# Patient Record
Sex: Male | Born: 1960
Health system: Southern US, Community
[De-identification: ages and names within clinical notes are randomized; demographics above are authoritative.]

## PROBLEM LIST (undated history)

## (undated) DIAGNOSIS — K5730 Diverticulosis of large intestine without perforation or abscess without bleeding: Secondary | ICD-10-CM

## (undated) DIAGNOSIS — E785 Hyperlipidemia, unspecified: Secondary | ICD-10-CM

## (undated) DIAGNOSIS — H269 Unspecified cataract: Secondary | ICD-10-CM

## (undated) DIAGNOSIS — R079 Chest pain, unspecified: Secondary | ICD-10-CM

## (undated) DIAGNOSIS — I4510 Unspecified right bundle-branch block: Secondary | ICD-10-CM

## (undated) DIAGNOSIS — E7800 Pure hypercholesterolemia, unspecified: Secondary | ICD-10-CM

## (undated) DIAGNOSIS — K219 Gastro-esophageal reflux disease without esophagitis: Secondary | ICD-10-CM

## (undated) DIAGNOSIS — R011 Cardiac murmur, unspecified: Secondary | ICD-10-CM

## (undated) DIAGNOSIS — K648 Other hemorrhoids: Secondary | ICD-10-CM

## (undated) HISTORY — DX: Hyperlipidemia, unspecified: E78.5

## (undated) HISTORY — DX: Unspecified cataract: H26.9

## (undated) HISTORY — PX: COLONOSCOPY: SHX174

## (undated) HISTORY — DX: Diverticulosis of large intestine without perforation or abscess without bleeding: K57.30

## (undated) HISTORY — DX: Other hemorrhoids: K64.8

## (undated) HISTORY — DX: Gastro-esophageal reflux disease without esophagitis: K21.9

## (undated) HISTORY — DX: Unspecified right bundle-branch block: I45.10

## (undated) HISTORY — DX: Cardiac murmur, unspecified: R01.1

---
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
## 1992-09-03 HISTORY — PX: APPENDECTOMY: SHX54

## 2006-06-03 LAB — HM COLONOSCOPY

## 2012-03-03 ENCOUNTER — Encounter (HOSPITAL_COMMUNITY): Payer: Self-pay | Admitting: Emergency Medicine

## 2012-03-03 ENCOUNTER — Emergency Department (HOSPITAL_COMMUNITY): Payer: 59

## 2012-03-03 ENCOUNTER — Emergency Department (HOSPITAL_COMMUNITY)
Admission: EM | Admit: 2012-03-03 | Discharge: 2012-03-03 | Disposition: A | Discharge status: Home / Self Care | Payer: 59 | Attending: Emergency Medicine | Admitting: Emergency Medicine

## 2012-03-03 DIAGNOSIS — S6980XA Other specified injuries of unspecified wrist, hand and finger(s), initial encounter: Secondary | ICD-10-CM | POA: Insufficient documentation

## 2012-03-03 DIAGNOSIS — Z9089 Acquired absence of other organs: Secondary | ICD-10-CM | POA: Insufficient documentation

## 2012-03-03 DIAGNOSIS — S6990XA Unspecified injury of unspecified wrist, hand and finger(s), initial encounter: Secondary | ICD-10-CM | POA: Insufficient documentation

## 2012-03-03 DIAGNOSIS — S6710XA Crushing injury of unspecified finger(s), initial encounter: Secondary | ICD-10-CM

## 2012-03-03 DIAGNOSIS — X58XXXA Exposure to other specified factors, initial encounter: Secondary | ICD-10-CM | POA: Insufficient documentation

## 2012-03-03 MED ORDER — BACITRACIN-NEOMYCIN-POLYMYXIN 400-5-5000 EX OINT
TOPICAL_OINTMENT | Freq: Once | CUTANEOUS | Status: AC
  Administered 2012-03-03: 1 via TOPICAL
  Filled 2012-03-03: qty 1

## 2012-03-03 NOTE — ED Provider Notes (Signed)
History   This chart was scribed for Geoffery Lyons, MD by Sofie Rower. The patient was seen in room APA07/APA07 and the patient's care was started at 8:17 AM     CSN: [id number]  Arrival date & time 03/03/12  4098   First MD Initiated Contact with Patient 03/03/12 0809      Chief Complaint  Patient presents with  . Finger Injury    (Consider location/radiation/quality/duration/timing/severity/associated sxs/prior treatment) HPI  Charles Doyle is a 51 y.o. male who presents to the Emergency Department complaining of moderate, episodic finger injury located at the left pinky finger onset today. The pt was reaching for the garage door handle when his pinky got caught. Modifying factors include application of bandage which provides moderate relief. Pt has a hx of appendectomy, tonsillectomy. Pt does not remember when he had his last tetanus shot. 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   PCP is Dr.    History reviewed. No pertinent past medical history.  Past Surgical History  Procedure Date  . Appendectomy   . Tonsillectomy     No family history on file.  History  Substance Use Topics  . Smoking status: Never Smoker   . Smokeless tobacco: Not on file  . Alcohol Use: Yes     occasional      Review of Systems  All other systems reviewed and are negative.    10 Systems reviewed and all are negative for acute change except as noted in the HPI.    Allergies  Review of patient's allergies indicates no known allergies.  Home Medications  No current outpatient prescriptions on file.  BP 125/86  Pulse 76  Temp 98.3 F (36.8 C) (Oral)  Resp 20  Ht 6\' 3"  (1.905 m)  Wt 208 lb (94.348 kg)  BMI 26.00 kg/m2  SpO2 96%  Physical Exam  Nursing note and vitals reviewed. Constitutional: He appears well-developed and well-nourished.  HENT:  Head: Atraumatic.  Right Ear: External ear normal.  Left Ear: External ear normal.  Nose: Nose normal.  Musculoskeletal:  Normal range of motion.       Laceration located at the left pinky finger. Palmer aspect of distal pha ynx, pinched, contused area with small .5 cm laceration. Swelling and ecchymosis present.   Neurological: He is alert.  Skin: Skin is warm and dry.  Psychiatric: He has a normal mood and affect. His behavior is normal.    ED Course  Procedures (including critical care time)  DIAGNOSTIC STUDIES: Oxygen Saturation is 96% on room air, adequate by my interpretation.    COORDINATION OF CARE:  8:21AM- EDP at bedside discusses treatment plan concerning application of splint on the left pinky finger, x-ray results.    Labs Reviewed - No data to display No results found for this or any previous visit. Dg Finger Little Left  03/03/2012  *RADIOLOGY REPORT*  Clinical Data: Trauma  LEFT LITTLE FINGER 2+V  Comparison: None.  Findings: Three views of the left fifth finger submitted.  No displaced fracture or subluxation.  The study is limited by bandage artifact.  There is subtle lucency at the tip of distal phalanx.  A subtle nondisplaced fracture cannot be excluded.  IMPRESSION: .  No displaced fracture or subluxation.  The study is limited by bandage artifact.  There is subtle lucency at the tip of distal phalanx.  A subtle nondisplaced fracture cannot be excluded.  Original Report Authenticated By: Natasha Mead, M.D.      No  diagnosis found.    MDM  No sutures indicated.  Will apply dressing and splint and allow to heal.  Return prn.      I personally performed the services described in this documentation, which was scribed in my presence. The recorded information has been reviewed and considered.      Geoffery Lyons, MD 03/04/12 [phone]

## 2012-03-03 NOTE — ED Notes (Signed)
Dressing applied to left 5 th finger with bacitracin, gauze and finger splint.

## 2012-03-03 NOTE — ED Notes (Signed)
Pt caught his left pinky in garage door this am.

## 2012-03-03 NOTE — ED Notes (Signed)
Pt has laceration to his left 5 th finger. States that he got it stuck in his garage door. No bleeding at this time. Pt states that he took 3 ibuprofen on the way to the hospital and it is controlling the pain.

## 2012-03-03 NOTE — Discharge Instructions (Signed)
Crush Injury, Fingers or Toes  A crush injury to the fingers or toes means the tissues have been damaged by being squeezed (compressed). There will be bleeding into the tissues and swelling. Often, blood will collect under the skin. When this happens, the skin on the finger often dies and may slough off (shed) 1 week to 10 days later. Usually, new skin is growing underneath. If the injury has been too severe and the tissue does not survive, the damaged tissue may begin to turn black over several days.   Wounds which occur because of the crushing may be stitched (sutured) shut. However, crush injuries are more likely to become infected than other injuries.These wounds may not be closed as tightly as other types of cuts to prevent infection. Nails involved are often lost. These usually grow back over several weeks.   DIAGNOSIS  X-rays may be taken to see if there is any injury to the bones.  TREATMENT  Broken bones (fractures) may be treated with splinting, depending on the fracture. Often, no treatment is required for fractures of the last bone in the fingers or toes.  HOME CARE INSTRUCTIONS    The crushed part should be raised (elevated) above the heart or center of the chest as much as possible for the first several days or as directed. This helps with pain and lessens swelling. Less swelling increases the chances that the crushed part will survive.   Put ice on the injured area.   Put ice in a plastic bag.   Place a towel between your skin and the bag.   Leave the ice on for 15 to 20 minutes, 3 to 4 times a day for the first 2 days.   Only take over-the-counter or prescription medicines for pain, discomfort, or fever as directed by your caregiver.   Use your injured part only as directed.   Change your bandages (dressings) as directed.   Keep all follow-up appointments as directed by your caregiver. Not keeping your appointment could result in a chronic or permanent injury, pain, and disability. If there  is any problem keeping the appointment, you must call to reschedule.  SEEK IMMEDIATE MEDICAL CARE IF:    There is redness, swelling, or increasing pain in the wound area.   Pus is coming from the wound.   You have a fever.   You notice a bad smell coming from the wound or dressing.   The edges of the wound do not stay together after the sutures have been removed.   You are unable to move the injured finger or toe.  MAKE SURE YOU:    Understand these instructions.   Will watch your condition.   Will get help right away if you are not doing well or get worse.  Document Released: 08/20/2005 Document Revised: 08/09/2011 Document Reviewed: 01/05/2011  Exit[employer] Patient Information 2012 [employer].

## 2012-04-10 ENCOUNTER — Emergency Department (HOSPITAL_COMMUNITY)
Admission: EM | Admit: 2012-04-10 | Discharge: 2012-04-10 | Disposition: A | Discharge status: Home / Self Care | Payer: 59 | Attending: Emergency Medicine | Admitting: Emergency Medicine

## 2012-04-10 ENCOUNTER — Encounter (HOSPITAL_COMMUNITY): Payer: Self-pay | Admitting: *Deleted

## 2012-04-10 ENCOUNTER — Emergency Department (HOSPITAL_COMMUNITY): Payer: 59

## 2012-04-10 DIAGNOSIS — Z9089 Acquired absence of other organs: Secondary | ICD-10-CM | POA: Insufficient documentation

## 2012-04-10 DIAGNOSIS — R079 Chest pain, unspecified: Secondary | ICD-10-CM | POA: Insufficient documentation

## 2012-04-10 DIAGNOSIS — E7800 Pure hypercholesterolemia, unspecified: Secondary | ICD-10-CM | POA: Insufficient documentation

## 2012-04-10 HISTORY — DX: Pure hypercholesterolemia, unspecified: E78.00

## 2012-04-10 LAB — BASIC METABOLIC PANEL
GFR calc Af Amer: >90 mL/min (ref >90)
GFR calc non Af Amer: 86 mL/min — ABNORMAL LOW (ref >90)
Potassium: 3.8 mEq/L (ref 3.5–5.1)
Sodium: 140 mEq/L (ref 135–145)

## 2012-04-10 LAB — CBC
Hemoglobin: 14.2 g/dL (ref 13.0–17.0)
MCHC: 34.1 g/dL (ref 30.0–36.0)
Platelets: 174 10*3/uL (ref 150–400)
RDW: 13.4 % (ref 11.5–15.5)

## 2012-04-10 LAB — TROPONIN I
Troponin I: <0.3 ng/mL (ref <0.30)
Troponin I: <0.3 ng/mL (ref <0.30)

## 2012-04-10 LAB — PRO B NATRIURETIC PEPTIDE: Pro B Natriuretic peptide (BNP): 7.9 pg/mL (ref 0–125)

## 2012-04-10 MED ORDER — NITROGLYCERIN 0.4 MG SL SUBL
0.4000 mg | SUBLINGUAL_TABLET | Freq: Once | SUBLINGUAL | Status: AC
  Administered 2012-04-10: 0.4 mg via SUBLINGUAL
  Filled 2012-04-10: qty 25

## 2012-04-10 MED ORDER — ASPIRIN 325 MG PO TABS
325.0000 mg | ORAL_TABLET | ORAL | Status: AC
  Administered 2012-04-10: 325 mg via ORAL
  Filled 2012-04-10: qty 1

## 2012-04-10 NOTE — ED Notes (Signed)
Charles Doyle was called and an appointment was scheduled for pt tomorrow 04/11/2012 at 1615. RN and MD made aware.

## 2012-04-10 NOTE — ED Notes (Signed)
Pt reports pain was relieved mostly before nitro, no change since administration. Pt reports he can feel a difference with position change.

## 2012-04-10 NOTE — ED Provider Notes (Signed)
History   This chart was scribed for Donnetta Hutching, MD by Charolett Bumpers . The patient was seen in room APA11/APA11. Patient's care was started at 0756.    CSN: [id number]  Arrival date & time 04/10/12  1308   First MD Initiated Contact with Patient 04/10/12 [phone]      Chief Complaint  Patient presents with  . Chest Pain    (Consider location/radiation/quality/duration/timing/severity/associated sxs/prior treatment) HPI Charles Doyle is a 50 y.o. male who presents to the Emergency Department complaining of constant, moderate, mid-sternal chest pain. Pt reports that his chest pain started this morning when he woke up. Pt states that his chest pain gradually worsening. Pt describes his chest pain as central, deep, pressure and sharp ache. Pt reports that today's chest pain does not feel like heart burn that he has experienced previously. Pt states that his chest pain is still present. Pt reports that he thought he felt radiation into his back and neck, but reports no radiation currently. Pt reports that his symptoms are aggravated with deep breaths. Pt denies any SOB, diaphoresis or nausea. Pt denies any prior medical h/o diabetes or HTN. Pt reports a medical h/o elevated cholesterol, but states that his HDL is high. Pt denies smoking. Pt denies taking aspirin daily currently. Pt states that his paternal grandfather died of MI at 84. 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   PCP: Dr. Donzetta Sprung in [place].  Past Medical History  Diagnosis Date  . Elevated cholesterol     Past Surgical History  Procedure Date  . Appendectomy   . Tonsillectomy     History reviewed. No pertinent family history.  History  Substance Use Topics  . Smoking status: Never Smoker   . Smokeless tobacco: Not on file  . Alcohol Use: Yes     occasional      Review of Systems  Constitutional: Negative for diaphoresis.  Respiratory: Negative for shortness of breath.   Cardiovascular: Positive for chest  pain.  Gastrointestinal: Negative for nausea.  All other systems reviewed and are negative.    Allergies  Review of patient's allergies indicates no known allergies.  Home Medications  No current outpatient prescriptions on file.  BP 120/90  Pulse 69  Temp 98.1 F (36.7 C) (Oral)  Resp 17  Ht 6\' 3"  (1.905 m)  Wt 212 lb (96.163 kg)  BMI 26.50 kg/m2  SpO2 96%  Physical Exam  Nursing note and vitals reviewed. Constitutional: He is oriented to person, place, and time. He appears well-developed and well-nourished. No distress.  HENT:  Head: Normocephalic and atraumatic.  Eyes: EOM are normal. Pupils are equal, round, and reactive to light.  Neck: Normal range of motion. Neck supple. No tracheal deviation present.  Cardiovascular: Normal rate.   Pulmonary/Chest: Effort normal. No respiratory distress. He exhibits no tenderness.  Abdominal: Soft. He exhibits no distension.  Musculoskeletal: Normal range of motion. He exhibits no edema.  Neurological: He is alert and oriented to person, place, and time. No sensory deficit.  Skin: Skin is warm and dry.  Psychiatric: He has a normal mood and affect. His behavior is normal.    ED Course  Procedures (including critical care time)  DIAGNOSTIC STUDIES: Oxygen Saturation is 97% on room air, normal by my interpretation.    COORDINATION OF CARE:  08:00-Medication Orders: Aspirin tablet 325-mg-STAT  08:12-Discussed planned course of treatment with the patient including routine cardiac workup, who is agreeable at this time.   08:30-Medication Orders:  Nitroglycerin (Nitrostat) SL tablet 0.4 mg-once  09:00-Recheck: Informed pt of lab and imaging results.   11:30-Consultation with [facility]. Discussed pt's case and ED results. Pt will f/u in the office tomorrow at 4:15pm.   Results for orders placed during the hospital encounter of 04/10/12  CBC      Component Value Range   WBC 7.7  4.0 - 10.5 K/uL   RBC 4.93  4.22 -  5.81 MIL/uL   Hemoglobin 14.2  13.0 - 17.0 g/dL   HCT 40.9  81.1 - 91.4 %   MCV 84.6  78.0 - 100.0 fL   MCH 28.8  26.0 - 34.0 pg   MCHC 34.1  30.0 - 36.0 g/dL   RDW 78.2  95.6 - 21.3 %   Platelets 174  150 - 400 K/uL  BASIC METABOLIC PANEL      Component Value Range   Sodium 140  135 - 145 mEq/L   Potassium 3.8  3.5 - 5.1 mEq/L   Chloride 106  96 - 112 mEq/L   CO2 26  19 - 32 mEq/L   Glucose, Bld 103 (*) 70 - 99 mg/dL   BUN 17  6 - 23 mg/dL   Creatinine, Ser 0.86  0.50 - 1.35 mg/dL   Calcium 57.8  8.4 - 46.9 mg/dL   GFR calc non Af Amer 86 (*) >90 mL/min   GFR calc Af Amer >90  >90 mL/min  PRO B NATRIURETIC PEPTIDE      Component Value Range   Pro B Natriuretic peptide (BNP) 7.9  0 - 125 pg/mL  TROPONIN I      Component Value Range   Troponin I <0.30  <0.30 ng/mL  TROPONIN I      Component Value Range   Troponin I <0.30  <0.30 ng/mL     Dg Chest 2 View  04/10/2012  *RADIOLOGY REPORT*  Clinical Data: Chest pain  CHEST - 2 VIEW  Comparison: None.  Findings: Aorta is ectatic and unfolded.  Cardiomediastinal silhouette is within normal limits. The lungs are clear. No pleural effusion.  No pneumothorax.  No acute osseous abnormality. Costophrenic angles are omitted from the frontal and lateral projections which could obscure trace effusion. Minimal inferior endplate compression deformity noted at a lower thoracic vertebral body, age indeterminate. Minimal AC joint degenerative change.  IMPRESSION: Age indeterminate minimal inferior endplate compression deformity at the inferior thoracic spine.  No acute cardiopulmonary process otherwise identified.  Original Report Authenticated By: Harrel Lemon, M.D.     No diagnosis found.  Date: 04/10/2012  Rate: 87  Rhythm: normal sinus rhythm  QRS Axis: left  Intervals: normal  ST/T Wave abnormalities: normal  Conduction Disutrbances:right bundle branch block  Narrative Interpretation:   Old EKG Reviewed: none available    MDM    Cardiac risk factors are low. History is vague for ACS or pulmonary embolus.  Will see cardiologist tomorrow at 4:15. Troponin negative x2    I personally performed the services described in this documentation, which was scribed in my presence. The recorded information has been reviewed and considered.      Donnetta Hutching, MD 04/10/12 1248

## 2012-04-10 NOTE — ED Notes (Signed)
Pt woke up with pain and pressure in center of chest

## 2012-04-11 ENCOUNTER — Encounter: Payer: 59 | Admitting: Cardiovascular Disease

## 2012-04-14 ENCOUNTER — Encounter: Payer: Self-pay | Admitting: Cardiology

## 2012-04-14 ENCOUNTER — Ambulatory Visit (INDEPENDENT_AMBULATORY_CARE_PROVIDER_SITE_OTHER): Payer: 59 | Admitting: Cardiovascular Disease

## 2012-04-14 ENCOUNTER — Encounter: Payer: Self-pay | Admitting: Cardiovascular Disease

## 2012-04-14 VITALS — Ht 75.0 in | Wt 210.0 lb

## 2012-04-14 DIAGNOSIS — I4510 Unspecified right bundle-branch block: Secondary | ICD-10-CM

## 2012-04-14 DIAGNOSIS — E7800 Pure hypercholesterolemia, unspecified: Secondary | ICD-10-CM | POA: Insufficient documentation

## 2012-04-14 DIAGNOSIS — R079 Chest pain, unspecified: Secondary | ICD-10-CM

## 2012-04-14 DIAGNOSIS — R011 Cardiac murmur, unspecified: Secondary | ICD-10-CM

## 2012-04-14 DIAGNOSIS — K219 Gastro-esophageal reflux disease without esophagitis: Secondary | ICD-10-CM

## 2012-04-14 DIAGNOSIS — R9431 Abnormal electrocardiogram [ECG] [EKG]: Secondary | ICD-10-CM

## 2012-04-14 HISTORY — DX: Pure hypercholesterolemia, unspecified: E78.00

## 2012-04-14 HISTORY — DX: Cardiac murmur, unspecified: R01.1

## 2012-04-14 HISTORY — DX: Unspecified right bundle-branch block: I45.10

## 2012-04-14 NOTE — Assessment & Plan Note (Signed)
Continue PPI  F/U Dr Garner Nash  Consider endo if symtoms return when PPI stopped

## 2012-04-14 NOTE — Assessment & Plan Note (Signed)
Patient indicates this was Dx in late 80's.  Gave him a copy of his ECG

## 2012-04-14 NOTE — Assessment & Plan Note (Signed)
Atypical likely GI  Family history of CAD and abnormal ECG  F/U stress echo in [place]

## 2012-04-14 NOTE — Progress Notes (Signed)
Patient ID: Charles Doyle, male   DOB: August 14, 1961, 51 y.o.   MRN: [id number] 51 yo patient referred by ER for chest pain.  Seen in ER  8/8.  complaining of constant, moderate, mid-sternal chest pain. Pt reports that his chest pain started  when he woke up. Pt states that his chest pain gradually worsening. Pt describes his chest pain as central, deep, pressure and sharp ache. Pt reports that today's chest pain does not feel like heart burn that he has experienced previously. Pt states that his chest pain is still present. Pt reports that he thought he felt radiation into his back and neck, but reports no radiation currently. Pt reports that his symptoms are aggravated with deep breaths. Pt denies any SOB, diaphoresis or nausea. Pt denies any prior medical h/o diabetes or HTN. Pt reports a medical h/o elevated cholesterol, but states that his HDL is high. Pt denies smoking. Pt denies taking aspirin daily currently. Pt states that his paternal grandfather died of MI at 71. 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   R/O CXR NAD And ECG as described below.  Pain improved after starting PPI at home.  Pain was not helped by nitro in ER  ROS: Denies fever, malais, weight loss, blurry vision, decreased visual acuity, cough, sputum, SOB, hemoptysis, pleuritic pain, palpitaitons, heartburn, abdominal pain, melena, lower extremity edema, claudication, or rash.  All other systems reviewed and negative   General: Affect appropriate Healthy:  appears stated age HEENT: normal Neck supple with no adenopathy JVP normal no bruits no thyromegaly Lungs clear with no wheezing and good diaphragmatic motion Heart:  S1/S2 soft systolic murmur,rub, gallop or click PMI normal Abdomen: benighn, BS positve, no tenderness, no AAA no bruit.  No HSM or HJR Distal pulses intact with no bruits No edema Neuro non-focal Skin warm and dry No muscular weakness  Medications Current Outpatient Prescriptions  Medication Sig  Dispense Refill  . calcium carbonate (TUMS EX) 750 MG chewable tablet Chew 1 tablet by mouth as needed. For acid/upset stomach      . fish oil-omega-3 fatty acids 1000 MG capsule Take 1 g by mouth daily.      . Multiple Vitamin (MULTIVITAMIN WITH MINERALS) TABS Take 1 tablet by mouth daily.        Allergies Review of patient's allergies indicates no known allergies.  Family History: No family history on file.  Social History: History   Social History  . Marital Status: Married    Spouse Name: N/A    Number of Children: N/A  . Years of Education: N/A   Occupational History  . Not on file.   Social History Main Topics  . Smoking status: Never Smoker   . Smokeless tobacco: Not on file  . Alcohol Use: Yes     occasional  . Drug Use: No  . Sexually Active:    Other Topics Concern  . Not on file   Social History Narrative  . No narrative on file    Electrocardiogram:  8/7  SR RBBB LAD ? Old IMI  Rate 87    Assessment and Plan

## 2012-04-14 NOTE — Assessment & Plan Note (Signed)
F/U Dr Garner Nash  Consider red yeast rice

## 2012-04-14 NOTE — Patient Instructions (Addendum)
Your physician has requested that you have a stress echocardiogram. For further information please visit [email or web address]. Please follow instruction sheet as given.  Your physician recommends that you schedule a follow-up appointment in: we will contact you with results of test.  

## 2012-04-14 NOTE — Assessment & Plan Note (Signed)
Previously noted in [employer] Benign systolic  Screen with echo during ETT echo

## 2012-04-24 ENCOUNTER — Encounter: Payer: Self-pay | Admitting: Cardiovascular Disease

## 2012-04-24 ENCOUNTER — Ambulatory Visit (HOSPITAL_COMMUNITY): Payer: 59 | Attending: Cardiology

## 2012-04-24 ENCOUNTER — Telehealth: Payer: Self-pay | Admitting: Cardiovascular Disease

## 2012-04-24 ENCOUNTER — Ambulatory Visit (HOSPITAL_COMMUNITY): Payer: 59 | Attending: Cardiovascular Disease

## 2012-04-24 DIAGNOSIS — R072 Precordial pain: Secondary | ICD-10-CM

## 2012-04-24 DIAGNOSIS — K219 Gastro-esophageal reflux disease without esophagitis: Secondary | ICD-10-CM | POA: Insufficient documentation

## 2012-04-24 DIAGNOSIS — Z8249 Family history of ischemic heart disease and other diseases of the circulatory system: Secondary | ICD-10-CM | POA: Insufficient documentation

## 2012-04-24 DIAGNOSIS — R0989 Other specified symptoms and signs involving the circulatory and respiratory systems: Secondary | ICD-10-CM

## 2012-04-24 DIAGNOSIS — R079 Chest pain, unspecified: Secondary | ICD-10-CM

## 2012-04-24 DIAGNOSIS — R9431 Abnormal electrocardiogram [ECG] [EKG]: Secondary | ICD-10-CM

## 2012-04-24 NOTE — Progress Notes (Signed)
Echocardiogram performed.  

## 2012-04-24 NOTE — Telephone Encounter (Signed)
Walk in pt Form " Pt Dropped Off Records" Placed in [place] Doc Box 04/24/12/Km

## 2012-10-18 ENCOUNTER — Other Ambulatory Visit: Payer: Self-pay

## 2013-07-09 ENCOUNTER — Other Ambulatory Visit: Payer: Self-pay

## 2014-09-14 ENCOUNTER — Ambulatory Visit (INDEPENDENT_AMBULATORY_CARE_PROVIDER_SITE_OTHER): Payer: 59 | Admitting: Internal Medicine

## 2014-09-14 ENCOUNTER — Encounter: Payer: Self-pay | Admitting: Internal Medicine

## 2014-09-14 ENCOUNTER — Other Ambulatory Visit (INDEPENDENT_AMBULATORY_CARE_PROVIDER_SITE_OTHER): Payer: 59

## 2014-09-14 VITALS — BP 128/78 | HR 74 | Temp 97.5°F | Ht 75.0 in | Wt 212.5 lb

## 2014-09-14 DIAGNOSIS — Z0000 Encounter for general adult medical examination without abnormal findings: Secondary | ICD-10-CM

## 2014-09-14 DIAGNOSIS — L918 Other hypertrophic disorders of the skin: Secondary | ICD-10-CM

## 2014-09-14 DIAGNOSIS — H538 Other visual disturbances: Secondary | ICD-10-CM

## 2014-09-14 LAB — URINALYSIS, ROUTINE W REFLEX MICROSCOPIC
Bilirubin Urine: NEGATIVE
Hgb urine dipstick: NEGATIVE
Ketones, ur: NEGATIVE
LEUKOCYTES UA: NEGATIVE
NITRITE: NEGATIVE
RBC / HPF: NONE SEEN (ref 0–?)
SPECIFIC GRAVITY, URINE: 1.01 (ref 1.000–1.030)
Total Protein, Urine: NEGATIVE
URINE GLUCOSE: NEGATIVE
UROBILINOGEN UA: 0.2 (ref 0.0–1.0)
WBC, UA: NONE SEEN (ref 0–?)
pH: 6.5 (ref 5.0–8.0)

## 2014-09-14 LAB — CBC WITH DIFFERENTIAL/PLATELET
BASOS ABS: 0 10*3/uL (ref 0.0–0.1)
Basophils Relative: 0.5 % (ref 0.0–3.0)
EOS ABS: 0.2 10*3/uL (ref 0.0–0.7)
Eosinophils Relative: 2.3 % (ref 0.0–5.0)
HCT: 43.5 % (ref 39.0–52.0)
Hemoglobin: 14.5 g/dL (ref 13.0–17.0)
LYMPHS PCT: 26.9 % (ref 12.0–46.0)
Lymphs Abs: 1.8 10*3/uL (ref 0.7–4.0)
MCHC: 33.3 g/dL (ref 30.0–36.0)
MCV: 86.9 fl (ref 78.0–100.0)
Monocytes Absolute: 0.5 10*3/uL (ref 0.1–1.0)
Monocytes Relative: 7.9 % (ref 3.0–12.0)
NEUTROS PCT: 62.4 % (ref 43.0–77.0)
Neutro Abs: 4.1 10*3/uL (ref 1.4–7.7)
PLATELETS: 187 10*3/uL (ref 150.0–400.0)
RBC: 5.01 Mil/uL (ref 4.22–5.81)
RDW: 14.4 % (ref 11.5–15.5)
WBC: 6.6 10*3/uL (ref 4.0–10.5)

## 2014-09-14 LAB — BASIC METABOLIC PANEL
BUN: 17 mg/dL (ref 6–23)
CO2: 28 mEq/L (ref 19–32)
Calcium: 9.6 mg/dL (ref 8.4–10.5)
Chloride: 103 mEq/L (ref 96–112)
Creatinine, Ser: 1.1 mg/dL (ref 0.4–1.5)
GFR: 74.51 mL/min (ref >60.00)
Glucose, Bld: 82 mg/dL (ref 70–99)
POTASSIUM: 4.8 meq/L (ref 3.5–5.1)
Sodium: 136 mEq/L (ref 135–145)

## 2014-09-14 LAB — LIPID PANEL
CHOL/HDL RATIO: 4
CHOLESTEROL: 262 mg/dL — AB (ref 0–200)
HDL: 72.9 mg/dL (ref >39.00)
LDL Cholesterol: 169 mg/dL — ABNORMAL HIGH (ref 0–99)
NonHDL: 189.1
TRIGLYCERIDES: 103 mg/dL (ref 0.0–149.0)
VLDL: 20.6 mg/dL (ref 0.0–40.0)

## 2014-09-14 LAB — HEPATIC FUNCTION PANEL
ALK PHOS: 45 U/L (ref 39–117)
ALT: 19 U/L (ref 0–53)
AST: 21 U/L (ref 0–37)
Albumin: 4.4 g/dL (ref 3.5–5.2)
BILIRUBIN DIRECT: 0.1 mg/dL (ref 0.0–0.3)
BILIRUBIN TOTAL: 1 mg/dL (ref 0.2–1.2)
Total Protein: 7.3 g/dL (ref 6.0–8.3)

## 2014-09-14 LAB — TSH: TSH: 1.3 u[IU]/mL (ref 0.35–4.50)

## 2014-09-14 LAB — PSA: PSA: 0.49 ng/mL (ref 0.10–4.00)

## 2014-09-14 NOTE — Progress Notes (Signed)
Subjective:    Patient ID: Charles Doyle, male    DOB: 1960/09/14, 54 y.o.   MRN: [id number]  HPI  New patient to me, here to establish with PCP patient is here today for annual physical. Patient feels well and has no complaints.  Also reviewed chronic medical issues and interval medical events  Past Medical History  Diagnosis Date  . Hyperlipidemia     prev on Niacin when 89#, now diet controlled  . GERD (gastroesophageal reflux disease)     occ   Family History  Problem Relation Age of Onset  . Hyperlipidemia Father   . Coronary artery disease Paternal Grandfather 53    sudden MI  . Arrhythmia Brother 45    ablation x 3 (?SVT vs AF)  . Arrhythmia Cousin 30    sudden death  . Colon polyps Father    History  Substance Use Topics  . Smoking status: Never Smoker   . Smokeless tobacco: Not on file  . Alcohol Use: 0.0 oz/week    0 Not specified per week     Comment: occasional    Review of Systems  Constitutional: Negative for fever, activity change, appetite change, fatigue and unexpected weight change.  Eyes: Positive for visual disturbance (reports "cataract dx" 07/2014, ?R eye).  Respiratory: Negative for cough, chest tightness, shortness of breath and wheezing.   Cardiovascular: Negative for chest pain, palpitations and leg swelling.  Skin:       Chronically irritated mole of sternum, ?remove  Neurological: Negative for dizziness, weakness and headaches.  Psychiatric/Behavioral: Negative for dysphoric mood. The patient is not nervous/anxious.   All other systems reviewed and are negative.      Objective:   Physical Exam  BP 128/78 mmHg  Pulse 74  Temp(Src) 97.5 F (36.4 C) (Oral)  Ht 6\' 3"  (1.905 m)  Wt 212 lb 8 oz (96.389 kg)  BMI 26.56 kg/m2  SpO2 97% Wt Readings from Last 3 Encounters:  09/14/14 212 lb 8 oz (96.389 kg)  04/14/12 210 lb (95.255 kg)  04/10/12 212 lb (96.163 kg)   Constitutional: he appears well-developed and well-nourished. No  distress.  HENT: Head: Normocephalic and atraumatic. Ears: B TMs ok, no erythema or effusion; Nose: Nose normal. Mouth/Throat: Oropharynx is clear and moist. No oropharyngeal exudate.  Eyes: Conjunctivae and EOM are normal. Pupils are equal, round, and reactive to light. No scleral icterus.  Neck: Normal range of motion. Neck supple. No JVD present. No thyromegaly present.  Cardiovascular: Normal rate, regular rhythm and normal heart sounds.  No murmur heard. No BLE edema. Pulmonary/Chest: Effort normal and breath sounds normal. No respiratory distress. he has no wheezes.  Abdominal: Soft. Bowel sounds are normal. he exhibits no distension. There is no tenderness. no masses GU: defer Musculoskeletal: Normal range of motion, no joint effusions. No gross deformities Neurological: he is alert and oriented to person, place, and time. No cranial nerve deficit. Coordination, balance, strength, speech and gait are normal.  Skin: Thick pedunculated flesh-colored skin tag on lower sternum. No ulceration or erythema . Remaining skin is warm and dry. No rash noted. No erythema.  Psychiatric: he has a normal mood and affect. behavior is normal. Judgment and thought content normal.   Lab Results  Component Value Date   WBC 7.7 04/10/2012   HGB 14.2 04/10/2012   HCT 41.7 04/10/2012   PLT 174 04/10/2012   GLUCOSE 103* 04/10/2012   NA 140 04/10/2012   K 3.8 04/10/2012   CL 106  04/10/2012   CREATININE 1.00 04/10/2012   BUN 17 04/10/2012   CO2 26 04/10/2012    Dg Chest 2 View  04/10/2012   *RADIOLOGY REPORT*  Clinical Data: Chest pain  CHEST - 2 VIEW  Comparison: None.  Findings: Aorta is ectatic and unfolded.  Cardiomediastinal silhouette is within normal limits. The lungs are clear. No pleural effusion.  No pneumothorax.  No acute osseous abnormality. Costophrenic angles are omitted from the frontal and lateral projections which could obscure trace effusion. Minimal inferior endplate compression  deformity noted at a lower thoracic vertebral body, age indeterminate. Minimal AC joint degenerative change.  IMPRESSION: Age indeterminate minimal inferior endplate compression deformity at the inferior thoracic spine.  No acute cardiopulmonary process otherwise identified.  Original Report Authenticated By: Arline Asp, M.D.      Assessment & Plan:   CPX/z00.00 - Patient has been counseled on age-appropriate routine health concerns for screening and prevention. These are reviewed and up-to-date. Immunizations are up-to-date or declined. Labs and ECG reviewed.  Problem List Items Addressed This Visit    Blurred vision, right eye    Dr Melida Gimenez - "[facility]" dx cataracts 07/2014 Will refer to [facility] ophthalmology for second opinion on same    Relevant Orders      Ambulatory referral to Ophthalmology    Other Visit Diagnoses    Routine general medical examination at a health care facility    -  Primary    Relevant Orders       Basic metabolic panel       CBC with Differential       Hepatic function panel       Lipid panel       TSH       Urinalysis, Routine w reflex microscopic       PSA    Skin tag        Relevant Orders       Ambulatory referral to Dermatology

## 2014-09-14 NOTE — Patient Instructions (Addendum)
It was good to see you today.  We have reviewed your prior records including labs and tests today  Health Maintenance reviewed - all recommended immunizations and age-appropriate screenings are up-to-date.  Test(s) ordered today. Your results will be released to Dennis (or called to you) after review, usually within 72hours after test completion. If any changes need to be made, you will be notified at that same time.  Medications reviewed and updated, no changes recommended at this time.  we'll make referral to ophthalmologist and to dermatologist as discussed. Our office will contact you regarding appointment(s) once made.  Please schedule followup in 12 months for annual exam and labs, call sooner if problems.  Health Maintenance A healthy lifestyle and preventative care can promote health and wellness.  Maintain regular health, dental, and eye exams.  Eat a healthy diet. Foods like vegetables, fruits, whole grains, low-fat dairy products, and lean protein foods contain the nutrients you need and are low in calories. Decrease your intake of foods high in solid fats, added sugars, and salt. Get information about a proper diet from your health care provider, if necessary.  Regular physical exercise is one of the most important things you can do for your health. Most adults should get at least 150 minutes of moderate-intensity exercise (any activity that increases your heart rate and causes you to sweat) each week. In addition, most adults need muscle-strengthening exercises on 2 or more days a week.   Maintain a healthy weight. The body mass index (BMI) is a screening tool to identify possible weight problems. It provides an estimate of body fat based on height and weight. Your health care provider can find your BMI and can help you achieve or maintain a healthy weight. For males 20 years and older:  A BMI below 18.5 is considered underweight.  A BMI of 18.5 to 24.9 is normal.  A BMI  of 25 to 29.9 is considered overweight.  A BMI of 30 and above is considered obese.  Maintain normal blood lipids and cholesterol by exercising and minimizing your intake of saturated fat. Eat a balanced diet with plenty of fruits and vegetables. Blood tests for lipids and cholesterol should begin at age 91 and be repeated every 5 years. If your lipid or cholesterol levels are high, you are over age 25, or you are at high risk for heart disease, you may need your cholesterol levels checked more frequently.Ongoing high lipid and cholesterol levels should be treated with medicines if diet and exercise are not working.  If you smoke, find out from your health care provider how to quit. If you do not use tobacco, do not start.  Lung cancer screening is recommended for adults aged 73-80 years who are at high risk for developing lung cancer because of a history of smoking. A yearly low-dose CT scan of the lungs is recommended for people who have at least a 30-pack-year history of smoking and are current smokers or have quit within the past 15 years. A pack year of smoking is smoking an average of 1 pack of cigarettes a day for 1 year (for example, a 30-pack-year history of smoking could mean smoking 1 pack a day for 30 years or 2 packs a day for 15 years). Yearly screening should continue until the smoker has stopped smoking for at least 15 years. Yearly screening should be stopped for people who develop a health problem that would prevent them from having lung cancer treatment.  If you choose  to drink alcohol, do not have more than 2 drinks per day. One drink is considered to be 12 oz (360 mL) of beer, 5 oz (150 mL) of wine, or 1.5 oz (45 mL) of liquor.  Avoid the use of street drugs. Do not share needles with anyone. Ask for help if you need support or instructions about stopping the use of drugs.  High blood pressure causes heart disease and increases the risk of stroke. Blood pressure should be checked  at least every 1-2 years. Ongoing high blood pressure should be treated with medicines if weight loss and exercise are not effective.  If you are 29-29 years old, ask your health care provider if you should take aspirin to prevent heart disease.  Diabetes screening involves taking a blood sample to check your fasting blood sugar level. This should be done once every 3 years after age 62 if you are at a normal weight and without risk factors for diabetes. Testing should be considered at a younger age or be carried out more frequently if you are overweight and have at least 1 risk factor for diabetes.  Colorectal cancer can be detected and often prevented. Most routine colorectal cancer screening begins at the age of 71 and continues through age 49. However, your health care provider may recommend screening at an earlier age if you have risk factors for colon cancer. On a yearly basis, your health care provider may provide home test kits to check for hidden blood in the stool. A small camera at the end of a tube may be used to directly examine the colon (sigmoidoscopy or colonoscopy) to detect the earliest forms of colorectal cancer. Talk to your health care provider about this at age 35 when routine screening begins. A direct exam of the colon should be repeated every 5-10 years through age 21, unless early forms of precancerous polyps or small growths are found.  People who are at an increased risk for hepatitis B should be screened for this virus. You are considered at high risk for hepatitis B if:  You were born in a country where hepatitis B occurs often. Talk with your health care provider about which countries are considered high risk.  Your parents were born in a high-risk country and you have not received a shot to protect against hepatitis B (hepatitis B vaccine).  You have HIV or AIDS.  You use needles to inject street drugs.  You live with, or have sex with, someone who has hepatitis  B.  You are a man who has sex with other men (MSM).  You get hemodialysis treatment.  You take certain medicines for conditions like cancer, organ transplantation, and autoimmune conditions.  Hepatitis C blood testing is recommended for all people born from [birth year] through 1965 and any individual with known risk factors for hepatitis C.  Healthy men should no longer receive prostate-specific antigen (PSA) blood tests as part of routine cancer screening. Talk to your health care provider about prostate cancer screening.  Testicular cancer screening is not recommended for adolescents or adult males who have no symptoms. Screening includes self-exam, a health care provider exam, and other screening tests. Consult with your health care provider about any symptoms you have or any concerns you have about testicular cancer.  Practice safe sex. Use condoms and avoid high-risk sexual practices to reduce the spread of sexually transmitted infections (STIs).  You should be screened for STIs, including gonorrhea and chlamydia if:  You are sexually active  and are younger than 24 years.  You are older than 24 years, and your health care provider tells you that you are at risk for this type of infection.  Your sexual activity has changed since you were last screened, and you are at an increased risk for chlamydia or gonorrhea. Ask your health care provider if you are at risk.  If you are at risk of being infected with HIV, it is recommended that you take a prescription medicine daily to prevent HIV infection. This is called pre-exposure prophylaxis (PrEP). You are considered at risk if:  You are a man who has sex with other men (MSM).  You are a heterosexual man who is sexually active with multiple partners.  You take drugs by injection.  You are sexually active with a partner who has HIV.  Talk with your health care provider about whether you are at high risk of being infected with HIV. If you  choose to begin PrEP, you should first be tested for HIV. You should then be tested every 3 months for as long as you are taking PrEP.  Use sunscreen. Apply sunscreen liberally and repeatedly throughout the day. You should seek shade when your shadow is shorter than you. Protect yourself by wearing long sleeves, pants, a wide-brimmed hat, and sunglasses year round whenever you are outdoors.  Tell your health care provider of new moles or changes in moles, especially if there is a change in shape or color. Also, tell your health care provider if a mole is larger than the size of a pencil eraser.  A one-time screening for abdominal aortic aneurysm (AAA) and surgical repair of large AAAs by ultrasound is recommended for men aged 27-75 years who are current or former smokers.  Stay current with your vaccines (immunizations). Document Released: 02/16/2008 Document Revised: 08/25/2013 Document Reviewed: 01/15/2011 [facility] Patient Information 2015 [place], [place]. This information is not intended to replace advice given to you by your health care provider. Make sure you discuss any questions you have with your health care provider.

## 2014-09-14 NOTE — Assessment & Plan Note (Signed)
Dr Melida Gimenez - "[facility]" dx cataracts 07/2014 Will refer to [facility] ophthalmology for second opinion on same

## 2014-09-14 NOTE — Progress Notes (Signed)
Pre visit review using our clinic review tool, if applicable. No additional management support is needed unless otherwise documented below in the visit note. 

## 2015-07-22 ENCOUNTER — Encounter: Payer: Self-pay | Admitting: Internal Medicine

## 2015-09-19 ENCOUNTER — Ambulatory Visit: Payer: 59 | Admitting: Internal Medicine

## 2015-09-23 ENCOUNTER — Ambulatory Visit (INDEPENDENT_AMBULATORY_CARE_PROVIDER_SITE_OTHER): Payer: 59 | Admitting: Internal Medicine

## 2015-09-23 ENCOUNTER — Encounter: Payer: Self-pay | Admitting: Internal Medicine

## 2015-09-23 VITALS — BP 118/84 | HR 94 | Temp 98.3°F | Resp 16 | Ht 75.0 in | Wt 219.0 lb

## 2015-09-23 DIAGNOSIS — K219 Gastro-esophageal reflux disease without esophagitis: Secondary | ICD-10-CM

## 2015-09-23 DIAGNOSIS — Z0000 Encounter for general adult medical examination without abnormal findings: Secondary | ICD-10-CM

## 2015-09-23 DIAGNOSIS — H269 Unspecified cataract: Secondary | ICD-10-CM

## 2015-09-23 DIAGNOSIS — C4491 Basal cell carcinoma of skin, unspecified: Secondary | ICD-10-CM | POA: Insufficient documentation

## 2015-09-23 HISTORY — DX: Unspecified cataract: H26.9

## 2015-09-23 NOTE — Assessment & Plan Note (Addendum)
Symptomatic if eats too much sugar or grease TUMs as needed - daily if not watching what he eats, if he is good - does not need them Discussed need to adjust diet to avoid medications

## 2015-09-23 NOTE — Progress Notes (Signed)
Subjective:    Patient ID: Charles Doyle, male    DOB: [birth year], 55 y.o.   MRN: [id number]  HPI He is here to establish with a new pcp.  He is here for a physical.    He has no concerns.  He exercising regularly.    Medications and allergies reviewed with patient and updated if appropriate.  Patient Active Problem List   Diagnosis Date Noted  . Cataract 09/23/2015  . Basal cell carcinoma 09/23/2015  . Elevated cholesterol 04/14/2012  . RBBB 04/14/2012  . Murmur 04/14/2012  . GERD (gastroesophageal reflux disease) 04/14/2012    No current outpatient prescriptions on file prior to visit.   No current facility-administered medications on file prior to visit.    Past Medical History  Diagnosis Date  . Hyperlipidemia     prev on Niacin when 89#, now diet controlled  . GERD (gastroesophageal reflux disease)     occ    Past Surgical History  Procedure Laterality Date  . Appendectomy  1994  . Tonsillectomy  1973    Social History   Social History  . Marital Status: Married    Spouse Name: N/A  . Number of Children: N/A  . Years of Education: N/A   Social History Main Topics  . Smoking status: Never Smoker   . Smokeless tobacco: Never Used  . Alcohol Use: 4.2 oz/week    7 Standard drinks or equivalent per week     Comment: occasional  . Drug Use: No  . Sexual Activity: Not Asked   Other Topics Concern  . 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 Hyperlipidemia Father   . Colon polyps Father   . Coronary artery disease Paternal Grandfather 22    sudden MI  . Arrhythmia Brother 45    ablation x 3 (?SVT vs AF)  . Hyperlipidemia Brother   . Arrhythmia Cousin 72    sudden death  . Hyperlipidemia Sister     Review of Systems  Constitutional: Negative for fever, chills, appetite  change, fatigue and unexpected weight change.  HENT: Negative for hearing loss.   Eyes: Negative for visual disturbance.  Respiratory: Negative for cough, shortness of breath and wheezing.   Cardiovascular: Negative for chest pain, palpitations and leg swelling.  Gastrointestinal: Negative for nausea, abdominal pain, diarrhea, constipation and blood in stool.  Genitourinary: Negative for dysuria, hematuria and difficulty urinating.  Skin: Negative for rash.  Neurological: Negative for dizziness, weakness, light-headedness, numbness and headaches.  Psychiatric/Behavioral: Negative for sleep disturbance and dysphoric mood. The patient is not nervous/anxious.        Objective:   Filed Vitals:   09/23/15 1341  BP: 118/84  Pulse: 94  Temp: 98.3 F (36.8 C)  Resp: 16   Filed Weights   09/23/15 1341  Weight: 219 lb (99.338 kg)   Body mass index is 27.37 kg/(m^2).   Physical Exam Constitutional: He appears well-developed and well-nourished. No distress.  HENT:  Head: Normocephalic and atraumatic.  Right Ear: External ear normal.  Left Ear: External ear normal.  Mouth/Throat: Oropharynx is clear and moist.  Normal ear canals and TM b/l  Eyes: Conjunctivae and EOM are normal.  Neck: Neck supple. No tracheal deviation present. No thyromegaly present.  No carotid  bruit  Cardiovascular: Normal rate, regular rhythm, normal heart sounds and intact distal pulses.   No murmur heard. Pulmonary/Chest: Effort normal and breath sounds normal. No respiratory distress. He has no wheezes. He has no rales.  Abdominal: Soft. Bowel sounds are normal. He exhibits no distension. There is no tenderness.  Genitourinary:  deferred  Musculoskeletal: He exhibits no edema.  Lymphadenopathy:    He has no cervical adenopathy.  Skin: Skin is warm and dry. He is not diaphoretic.  Psychiatric: He has a normal mood and affect. His behavior is normal.       Assessment & Plan:   Physical  exam: Screening blood work ordered EKG today to ensure stablity Colonoscopy up to date Immunizations up to date Continue regular exercise No concern for substance abuse He will see derm for skin check Discussed GERD - advised lifestyle changes and decrease Tums intake Discussed healthy diet  Follow up annually for a PE

## 2015-09-23 NOTE — Progress Notes (Signed)
Pre visit review using our clinic review tool, if applicable. No additional management support is needed unless otherwise documented below in the visit note. 

## 2015-09-23 NOTE — Patient Instructions (Signed)
We have reviewed your prior records including labs and tests today.  Test(s) ordered today. Your results will be released to [place] (or called to you) after review, usually within 72hours after test completion. If any changes need to be made, you will be notified at that same time.  All other Health Maintenance issues reviewed.   All recommended immunizations and age-appropriate screenings are up-to-date.  No immunizations administered today.   An EKG was done today.   Adjust your diet to prevent heartburn symptoms.   Health Maintenance, Male A healthy lifestyle and preventative care can promote health and wellness.  Maintain regular health, dental, and eye exams.  Eat a healthy diet. Foods like vegetables, fruits, whole grains, low-fat dairy products, and lean protein foods contain the nutrients you need and are low in calories. Decrease your intake of foods high in solid fats, added sugars, and salt. Get information about a proper diet from your health care provider, if necessary.  Regular physical exercise is one of the most important things you can do for your health. Most adults should get at least 150 minutes of moderate-intensity exercise (any activity that increases your heart rate and causes you to sweat) each week. In addition, most adults need muscle-strengthening exercises on 2 or more days a week.   Maintain a healthy weight. The body mass index (BMI) is a screening tool to identify possible weight problems. It provides an estimate of body fat based on height and weight. Your health care provider can find your BMI and can help you achieve or maintain a healthy weight. For males 20 years and older:  A BMI below 18.5 is considered underweight.  A BMI of 18.5 to 24.9 is normal.  A BMI of 25 to 29.9 is considered overweight.  A BMI of 30 and above is considered obese.  Maintain normal blood lipids and cholesterol by exercising and minimizing your intake of saturated fat.  Eat a balanced diet with plenty of fruits and vegetables. Blood tests for lipids and cholesterol should begin at age 62 and be repeated every 5 years. If your lipid or cholesterol levels are high, you are over age 90, or you are at high risk for heart disease, you may need your cholesterol levels checked more frequently.Ongoing high lipid and cholesterol levels should be treated with medicines if diet and exercise are not working.  If you smoke, find out from your health care provider how to quit. If you do not use tobacco, do not start.  Lung cancer screening is recommended for adults aged 44-80 years who are at high risk for developing lung cancer because of a history of smoking. A yearly low-dose CT scan of the lungs is recommended for people who have at least a 30-pack-year history of smoking and are current smokers or have quit within the past 15 years. A pack year of smoking is smoking an average of 1 pack of cigarettes a day for 1 year (for example, a 30-pack-year history of smoking could mean smoking 1 pack a day for 30 years or 2 packs a day for 15 years). Yearly screening should continue until the smoker has stopped smoking for at least 15 years. Yearly screening should be stopped for people who develop a health problem that would prevent them from having lung cancer treatment.  If you choose to drink alcohol, do not have more than 2 drinks per day. One drink is considered to be 12 oz (360 mL) of beer, 5 oz (150 mL)  of wine, or 1.5 oz (45 mL) of liquor.  Avoid the use of street drugs. Do not share needles with anyone. Ask for help if you need support or instructions about stopping the use of drugs.  High blood pressure causes heart disease and increases the risk of stroke. High blood pressure is more likely to develop in:  People who have blood pressure in the end of the normal range (100-139/85-89 mm Hg).  People who are overweight or obese.  People who are African American.  If you are  73-46 years of age, have your blood pressure checked every 3-5 years. If you are 21 years of age or older, have your blood pressure checked every year. You should have your blood pressure measured twice--once when you are at a hospital or clinic, and once when you are not at a hospital or clinic. Record the average of the two measurements. To check your blood pressure when you are not at a hospital or clinic, you can use:  An automated blood pressure machine at a pharmacy.  A home blood pressure monitor.  If you are 21-53 years old, ask your health care provider if you should take aspirin to prevent heart disease.  Diabetes screening involves taking a blood sample to check your fasting blood sugar level. This should be done once every 3 years after age 14 if you are at a normal weight and without risk factors for diabetes. Testing should be considered at a younger age or be carried out more frequently if you are overweight and have at least 1 risk factor for diabetes.  Colorectal cancer can be detected and often prevented. Most routine colorectal cancer screening begins at the age of 28 and continues through age 50. However, your health care provider may recommend screening at an earlier age if you have risk factors for colon cancer. On a yearly basis, your health care provider may provide home test kits to check for hidden blood in the stool. A small camera at the end of a tube may be used to directly examine the colon (sigmoidoscopy or colonoscopy) to detect the earliest forms of colorectal cancer. Talk to your health care provider about this at age 63 when routine screening begins. A direct exam of the colon should be repeated every 5-10 years through age 41, unless early forms of precancerous polyps or small growths are found.  People who are at an increased risk for hepatitis B should be screened for this virus. You are considered at high risk for hepatitis B if:  You were born in a country where  hepatitis B occurs often. Talk with your health care provider about which countries are considered high risk.  Your parents were born in a high-risk country and you have not received a shot to protect against hepatitis B (hepatitis B vaccine).  You have HIV or AIDS.  You use needles to inject street drugs.  You live with, or have sex with, someone who has hepatitis B.  You are a man who has sex with other men (MSM).  You get hemodialysis treatment.  You take certain medicines for conditions like cancer, organ transplantation, and autoimmune conditions.  Hepatitis C blood testing is recommended for all people born from [birth year] through 1964 and any individual with known risk factors for hepatitis C.  Healthy men should no longer receive prostate-specific antigen (PSA) blood tests as part of routine cancer screening. Talk to your health care provider about prostate cancer screening.  Testicular cancer  screening is not recommended for adolescents or adult males who have no symptoms. Screening includes self-exam, a health care provider exam, and other screening tests. Consult with your health care provider about any symptoms you have or any concerns you have about testicular cancer.  Practice safe sex. Use condoms and avoid high-risk sexual practices to reduce the spread of sexually transmitted infections (STIs).  You should be screened for STIs, including gonorrhea and chlamydia if:  You are sexually active and are younger than 24 years.  You are older than 24 years, and your health care provider tells you that you are at risk for this type of infection.  Your sexual activity has changed since you were last screened, and you are at an increased risk for chlamydia or gonorrhea. Ask your health care provider if you are at risk.  If you are at risk of being infected with HIV, it is recommended that you take a prescription medicine daily to prevent HIV infection. This is called pre-exposure  prophylaxis (PrEP). You are considered at risk if:  You are a man who has sex with other men (MSM).  You are a heterosexual man who is sexually active with multiple partners.  You take drugs by injection.  You are sexually active with a partner who has HIV.  Talk with your health care provider about whether you are at high risk of being infected with HIV. If you choose to begin PrEP, you should first be tested for HIV. You should then be tested every 3 months for as long as you are taking PrEP.  Use sunscreen. Apply sunscreen liberally and repeatedly throughout the day. You should seek shade when your shadow is shorter than you. Protect yourself by wearing long sleeves, pants, a wide-brimmed hat, and sunglasses year round whenever you are outdoors.  Tell your health care provider of new moles or changes in moles, especially if there is a change in shape or color. Also, tell your health care provider if a mole is larger than the size of a pencil eraser.  A one-time screening for abdominal aortic aneurysm (AAA) and surgical repair of large AAAs by ultrasound is recommended for men aged 43-75 years who are current or former smokers.  Stay current with your vaccines (immunizations).   This information is not intended to replace advice given to you by your health care provider. Make sure you discuss any questions you have with your health care provider.   Document Released: 02/16/2008 Document Revised: 09/10/2014 Document Reviewed: 01/15/2011 Elsevier Interactive Patient Education [employer].

## 2016-05-01 DIAGNOSIS — Z0100 Encounter for examination of eyes and vision without abnormal findings: Secondary | ICD-10-CM | POA: Diagnosis not present

## 2017-05-08 ENCOUNTER — Emergency Department (HOSPITAL_COMMUNITY): Payer: 59

## 2017-05-08 ENCOUNTER — Encounter (HOSPITAL_COMMUNITY): Payer: Self-pay | Admitting: *Deleted

## 2017-05-08 ENCOUNTER — Other Ambulatory Visit: Payer: Self-pay | Admitting: Nurse Practitioner

## 2017-05-08 ENCOUNTER — Observation Stay (HOSPITAL_COMMUNITY)
Admission: EM | Admit: 2017-05-08 | Discharge: 2017-05-08 | Disposition: A | Discharge status: Home / Self Care | Payer: 59 | Attending: Internal Medicine | Admitting: Internal Medicine

## 2017-05-08 DIAGNOSIS — Z8241 Family history of sudden cardiac death: Secondary | ICD-10-CM | POA: Insufficient documentation

## 2017-05-08 DIAGNOSIS — R072 Precordial pain: Principal | ICD-10-CM | POA: Insufficient documentation

## 2017-05-08 DIAGNOSIS — Z8349 Family history of other endocrine, nutritional and metabolic diseases: Secondary | ICD-10-CM | POA: Insufficient documentation

## 2017-05-08 DIAGNOSIS — R079 Chest pain, unspecified: Secondary | ICD-10-CM | POA: Diagnosis present

## 2017-05-08 DIAGNOSIS — Z8249 Family history of ischemic heart disease and other diseases of the circulatory system: Secondary | ICD-10-CM | POA: Insufficient documentation

## 2017-05-08 DIAGNOSIS — Z9049 Acquired absence of other specified parts of digestive tract: Secondary | ICD-10-CM | POA: Diagnosis not present

## 2017-05-08 DIAGNOSIS — E785 Hyperlipidemia, unspecified: Secondary | ICD-10-CM | POA: Insufficient documentation

## 2017-05-08 DIAGNOSIS — I452 Bifascicular block: Secondary | ICD-10-CM | POA: Diagnosis not present

## 2017-05-08 DIAGNOSIS — I4510 Unspecified right bundle-branch block: Secondary | ICD-10-CM | POA: Insufficient documentation

## 2017-05-08 DIAGNOSIS — H269 Unspecified cataract: Secondary | ICD-10-CM | POA: Diagnosis not present

## 2017-05-08 DIAGNOSIS — Z8371 Family history of colonic polyps: Secondary | ICD-10-CM | POA: Diagnosis not present

## 2017-05-08 DIAGNOSIS — K219 Gastro-esophageal reflux disease without esophagitis: Secondary | ICD-10-CM | POA: Insufficient documentation

## 2017-05-08 DIAGNOSIS — I493 Ventricular premature depolarization: Secondary | ICD-10-CM

## 2017-05-08 DIAGNOSIS — E7800 Pure hypercholesterolemia, unspecified: Secondary | ICD-10-CM | POA: Insufficient documentation

## 2017-05-08 DIAGNOSIS — E782 Mixed hyperlipidemia: Secondary | ICD-10-CM

## 2017-05-08 DIAGNOSIS — Z9889 Other specified postprocedural states: Secondary | ICD-10-CM | POA: Insufficient documentation

## 2017-05-08 HISTORY — DX: Chest pain, unspecified: R07.9

## 2017-05-08 LAB — RAPID URINE DRUG SCREEN, HOSP PERFORMED
AMPHETAMINES: NOT DETECTED
BENZODIAZEPINES: NOT DETECTED
Barbiturates: NOT DETECTED
COCAINE: NOT DETECTED
OPIATES: NOT DETECTED
Tetrahydrocannabinol: NOT DETECTED

## 2017-05-08 LAB — URINALYSIS, ROUTINE W REFLEX MICROSCOPIC
Bilirubin Urine: NEGATIVE
GLUCOSE, UA: NEGATIVE mg/dL
Hgb urine dipstick: NEGATIVE
Ketones, ur: NEGATIVE mg/dL
Leukocytes, UA: NEGATIVE
NITRITE: NEGATIVE
PH: 6 (ref 5.0–8.0)
Protein, ur: NEGATIVE mg/dL
SPECIFIC GRAVITY, URINE: 1.006 (ref 1.005–1.030)

## 2017-05-08 LAB — BASIC METABOLIC PANEL
ANION GAP: 10 (ref 5–15)
BUN: 18 mg/dL (ref 6–20)
CHLORIDE: 104 mmol/L (ref 101–111)
CO2: 25 mmol/L (ref 22–32)
Calcium: 9.4 mg/dL (ref 8.9–10.3)
Creatinine, Ser: 1.22 mg/dL (ref 0.61–1.24)
GFR calc non Af Amer: >60 mL/min (ref >60)
GLUCOSE: 121 mg/dL — AB (ref 65–99)
POTASSIUM: 4.3 mmol/L (ref 3.5–5.1)
Sodium: 139 mmol/L (ref 135–145)

## 2017-05-08 LAB — I-STAT TROPONIN, ED
TROPONIN I, POC: 0 ng/mL (ref 0.00–0.08)
Troponin i, poc: 0 ng/mL (ref 0.00–0.08)

## 2017-05-08 LAB — CBC
HEMATOCRIT: 43.4 % (ref 39.0–52.0)
HEMOGLOBIN: 14.6 g/dL (ref 13.0–17.0)
MCH: 29.3 pg (ref 26.0–34.0)
MCHC: 33.6 g/dL (ref 30.0–36.0)
MCV: 87.1 fL (ref 78.0–100.0)
Platelets: 179 10*3/uL (ref 150–400)
RBC: 4.98 MIL/uL (ref 4.22–5.81)
RDW: 13.4 % (ref 11.5–15.5)
WBC: 7.1 10*3/uL (ref 4.0–10.5)

## 2017-05-08 MED ORDER — NICARDIPINE HCL IN NACL 20-0.86 MG/200ML-% IV SOLN: 3.0000 mg/h | Freq: Once | INTRAVENOUS | Status: DC

## 2017-05-08 MED ORDER — ASPIRIN EC 81 MG PO TBEC: 81.0000 mg | DELAYED_RELEASE_TABLET | Freq: Every day | ORAL | 0 refills | Status: DC

## 2017-05-08 MED ORDER — NITROGLYCERIN 0.4 MG SL SUBL: 0.4000 mg | SUBLINGUAL_TABLET | SUBLINGUAL | Status: DC | PRN

## 2017-05-08 MED ORDER — ATORVASTATIN CALCIUM 20 MG PO TABS: 20.0000 mg | ORAL_TABLET | Freq: Every day | ORAL | 0 refills | Status: DC

## 2017-05-08 NOTE — ED Provider Notes (Signed)
7:44 PM: Dr. Sallyanne Kuster has evaluated the patient and feels that he is appropriate for discharge, we've had an extensive discussion of return precautions and patient verbalizes understanding and teach back technique. He will follow with cardiology and PCP.    Charles Doyle, Charles Doyle 05/08/17 1944    Fredia Sorrow, MD 05/09/17 [phone]

## 2017-05-08 NOTE — Discharge Summary (Addendum)
Discharge Summary  Charles Doyle JEH:[id number] DOB: 02/13/1961  PCP: Binnie Rail, MD  Admit date: 05/08/2017 Discharge date: 05/08/2017  Time spent: <71mins  Recommendations for Outpatient Follow-up:  1. F/u with PMD within a week  for hospital discharge follow up, repeat cbc/bmp at follow up 2. F/u with cardiology  Discharge Diagnoses:  Active Hospital Problems   Diagnosis Date Noted  . Chest pain 05/08/2017    Resolved Hospital Problems   Diagnosis Date Noted Date Resolved  No resolved problems to display.    Discharge Condition: stable  Diet recommendation: heart healthy  There were no vitals filed for this visit.  History of present illness:  Chief Complaint: chest pain rule out  HPI: Charles Doyle is a 56 y.o. male   With h/o HLD, GERD presented to the ED due to chest pain. He reports chest pain started at 1:30pm with substernal chest pain radiate to back and jaw.  he took two baby aspirins, by the time he arrived to the ED, the chest pain has resolved. Patient 's vital is stable, First set of troponin negative.   EDP called hospitalist to admit the patient for chest pain rule out. EDP is to call cardiology.    Hospital Course:  Active Problems:   Chest pain   Chest pain: - reported started at 1:30pm with substernal chest pain radiate to back and jaw, he took two baby aspirins, by the time he arrived to the ED, the chest pain has resolved -Risk factors including hyperlipidemia (last ldl in 2016 was 169 with hdl 72), report grandfather died from heart attack in his 31's, two brothers has afib -cxr" Mild interstitial prominence of both lungs slightly more conspicuous than in the past. This may reflect chronic bronchitic change. No alveolar pneumonia, CHF, nor other acute cardiopulmonary abnormality." patient denies sob, no hypoxia, no cough, no fever. -ekg with RBBB seems to be chronic, troponin negativex2  - cardiology evaluated in the ED, recommended  outpatient work up. He is discharged home with close cardiology follow up.  pvc's  He is cleared to discharge home by cardiology He is to have close Cardiology follow up  tsh/mag can be done on outpatient basis  HLD Report h/o niacin intolerance, advised to try apple sauce or asa with niacin to allievate niacin flush He is reluctant to start statin, cardiology recommended statin He is to have repeat lipid panel done on outpatient basis.    H/o acid reflux, GERD -Report related to eating greasy food -patient to follow up with PMD to  check lft, consider abdominal US to r/o gallbladder issues  -avoid alcohol, consider ppi       Consultants:  cardiology  Code Status: full   Family Communication:  Patient   Disposition Plan: home , cleared by cardiology   Procedures:  none  Consultations:  none  Discharge Exam: BP (!) 137/95   Pulse (!) 58   Temp 97.8 F (36.6 C) (Oral)   Resp 11   SpO2 95%   General: NAD Cardiovascular: RRR Respiratory: CATBL  Discharge Instructions You were cared for by a hospitalist during your hospital stay. If you have any questions about your discharge medications or the care you received while you were in the hospital after you are discharged, you can call the unit and asked to speak with the hospitalist on call if the hospitalist that took care of you is not available. Once you are discharged, your primary care physician will handle any further  medical issues. Please note that NO REFILLS for any discharge medications will be authorized once you are discharged, as it is imperative that you return to your primary care physician (or establish a relationship with a primary care physician if you do not have one) for your aftercare needs so that they can reassess your need for medications and monitor your lab values.  Discharge Instructions    Diet - low sodium heart healthy    Complete by:  As directed    Increase activity slowly     Complete by:  As directed      Allergies as of 05/08/2017   No Known Allergies     Medication List    TAKE these medications   aspirin EC 81 MG tablet Take 1 tablet (81 mg total) by mouth daily. What changed:  how much to take  when to take this  reasons to take this   atorvastatin 20 MG tablet Commonly known as:  LIPITOR Take 1 tablet (20 mg total) by mouth daily.   calcium carbonate 500 MG chewable tablet Commonly known as:  TUMS - dosed in mg elemental calcium Chew 1 tablet by mouth 2 (two) times daily as needed for indigestion or heartburn.            Discharge Care Instructions        Start     Ordered   05/08/17 0000  aspirin EC 81 MG tablet  Daily     05/08/17 1943   05/08/17 0000  Increase activity slowly     05/08/17 1943   05/08/17 0000  Diet - low sodium heart healthy     05/08/17 1943   05/08/17 0000  atorvastatin (LIPITOR) 20 MG tablet  Daily     05/08/17 1953     No Known Allergies Follow-up Information    Binnie Rail, MD Follow up in 1 week(s).   Specialty:  Internal Medicine Why:  for hospital discharge follow up, pmd to follow liver function test, consider gallbladder US  Contact information: [place] [address] [phone]        [facility].   Specialty:  Cardiology Contact information: [address], [place].   Specialty:  Emergency Medicine Why:  If symptoms worsen Contact information: [address] [id number] [place] [phone]           The results of significant diagnostics from this hospitalization (including imaging, microbiology, ancillary and laboratory) are listed below for reference.    Significant Diagnostic Studies: Dg Chest 2 View  Result Date: 05/08/2017 CLINICAL DATA:  Mid chest pain radiating into the  back and to the right jaw all earlier today associated with mild shortness of breath. Symptoms have since abated. No known cardiopulmonary issues. History of gastroesophageal reflux, never smoked. EXAM: CHEST  2 VIEW COMPARISON:  Chest x-ray of April 10, 2012 FINDINGS: The lungs are well-expanded. There is no focal infiltrate. The interstitial markings are coarse but not greatly changed from the previous study. The heart and pulmonary vascularity are normal. The mediastinum is normal in width. The bony thorax is unremarkable. IMPRESSION: Mild interstitial prominence of both lungs slightly more conspicuous than in the past. This may reflect chronic bronchitic change. No alveolar pneumonia, CHF, nor other acute cardiopulmonary abnormality. Electronically Signed   By: David  Martinique M.D.  On: 05/08/2017 14:37    Microbiology: No results found for this or any previous visit (from the past 240 hour(s)).   Labs: Basic Metabolic Panel:  Recent Labs Lab 05/08/17 1415  NA 139  K 4.3  CL 104  CO2 25  GLUCOSE 121*  BUN 18  CREATININE 1.22  CALCIUM 9.4   Liver Function Tests: No results for input(s): AST, ALT, ALKPHOS, BILITOT, PROT, ALBUMIN in the last 168 hours. No results for input(s): LIPASE, AMYLASE in the last 168 hours. No results for input(s): AMMONIA in the last 168 hours. CBC:  Recent Labs Lab 05/08/17 1415  WBC 7.1  HGB 14.6  HCT 43.4  MCV 87.1  PLT 179   Cardiac Enzymes: No results for input(s): CKTOTAL, CKMB, CKMBINDEX, TROPONINI in the last 168 hours. BNP: BNP (last 3 results) No results for input(s): BNP in the last 8760 hours.  ProBNP (last 3 results) No results for input(s): PROBNP in the last 8760 hours.  CBG: No results for input(s): GLUCAP in the last 168 hours.     SignedFlorencia Reasons MD, PhD  [facility] 05/08/2017, 7:53 PM

## 2017-05-08 NOTE — Discharge Instructions (Signed)
Please start taking an 81 mg aspirin daily. Do not perform any aerobic activity until you are cleared by either your primary care physician or cardiology.  Do not hesitate to return to the emergency room for any new, worsening or concerning symptoms including chest pain, shortness of breath, nausea vomiting.

## 2017-05-08 NOTE — ED Notes (Signed)
Cardiology at bedside.

## 2017-05-08 NOTE — ED Triage Notes (Signed)
Pt reports onset approx 50 mins ago of mid chest pain that radiates into his back. At onset pt had mild diaphoresis and mild nausea. Pain relieved pta. ekg done on arrival and airway intact.

## 2017-05-08 NOTE — ED Provider Notes (Signed)
[facility] DEPT Provider Note   CSN: [id number] Arrival date & time: 05/08/17  1408     History   Chief Complaint Chief Complaint  Patient presents with  . Chest Pain    HPI   Blood pressure 137/87, pulse 91, temperature 97.8 F (36.6 C), temperature source Oral, resp. rate 18, SpO2 98 %.  DEMITRIOUS MCCANNON is a 56 y.o. male With past medical history significant for acid reflux, hyperlipidemia (not treated with medication) complaining of acute onset of 4 out of 10 tightness in his chest radiating to the back onset at 1:30 PM when he was not exerting himself. Over the course of the next several minutes the pain radiated to the right jaw. It was associated with lightheadedness mild nausea with no shortness of breath or diaphoresis. The chest pain resolved spontaneously in the course of 30 minutes. He took 2 aspirin prior to arrival. Patient has a family history of ACS with grandfather dying at the age of 62 from a heart attack, father has A. Fib no history of heart attacks Brother also has A. Fib. Does not follow regularly with a cardiologist and has not had a recent stress test. Pain has been constant, non-exertional, non-pleuritic or positional. Denies SOB, N/V, diaphoresis, cough, fever, back pain, syncope, prior episodes, recent cocaine/methamphetimine use.  Denies h/o DVT, PE,  recent travel, leg swelling, hemoptysis. He states that this pain feels different than his typical acid reflux pain.   Past Medical History:  Diagnosis Date  . GERD (gastroesophageal reflux disease)    occ  . Hyperlipidemia    prev on Niacin when 240#, now diet controlled    Patient Active Problem List   Diagnosis Date Noted  . Chest pain 05/08/2017  . Cataract 09/23/2015  . Basal cell carcinoma 09/23/2015  . Elevated cholesterol 04/14/2012  . RBBB 04/14/2012  . Murmur 04/14/2012  . GERD (gastroesophageal reflux disease) 04/14/2012    Past Surgical History:  Procedure Laterality Date  .  APPENDECTOMY  1994  . TONSILLECTOMY  1973       Home Medications    Prior to Admission medications   Medication Sig Start Date End Date Taking? Authorizing Provider  aspirin EC 81 MG tablet Take 162 mg by mouth once as needed (for chest pain).   Yes [provider]  calcium carbonate (TUMS - DOSED IN MG ELEMENTAL CALCIUM) 500 MG chewable tablet Chew 1 tablet by mouth 2 (two) times daily as needed for indigestion or heartburn.   Yes [provider]    Family History Family History  Problem Relation Age of Onset  . Hyperlipidemia Father   . Colon polyps Father   . Arrhythmia Brother 45       ablation x 3 (?SVT vs AF)  . Hyperlipidemia Brother   . Hyperlipidemia Sister   . Coronary artery disease Paternal Grandfather 45       sudden MI  . Arrhythmia Cousin 55       sudden death    Social History Social History  Substance Use Topics  . Smoking status: Never Smoker  . Smokeless tobacco: Never Used  . Alcohol use 4.2 oz/week    7 Standard drinks or equivalent per week     Comment: occasional     Allergies   Patient has no known allergies.   Review of Systems Review of Systems  A complete review of systems was obtained and all systems are negative except as noted in the HPI and PMH.  Physical Exam Updated Vital Signs BP (!) 137/95   Pulse 60   Temp 97.8 F (36.6 C) (Oral)   Resp 18   SpO2 98%   Physical Exam  Constitutional: He is oriented to person, place, and time. He appears well-developed and well-nourished. No distress.  HENT:  Head: Normocephalic.  Mouth/Throat: Oropharynx is clear and moist.  Eyes: Conjunctivae are normal.  Neck: Normal range of motion. No JVD present. No tracheal deviation present.  Cardiovascular: Normal rate, regular rhythm and intact distal pulses.   Radial pulse equal bilaterally  Pulmonary/Chest: Effort normal and breath sounds normal. No stridor. No respiratory distress. He has no wheezes. He has no rales.  He exhibits no tenderness.  Abdominal: Soft. He exhibits no distension and no mass. There is no tenderness. There is no rebound and no guarding.  Musculoskeletal: Normal range of motion. He exhibits no edema or tenderness.  No calf asymmetry, superficial collaterals, palpable cords, edema, Homans sign negative bilaterally.    Neurological: He is alert and oriented to person, place, and time.  Skin: Skin is warm. He is not diaphoretic.  Psychiatric: He has a normal mood and affect.  Nursing note and vitals reviewed.    ED Treatments / Results  Labs (all labs ordered are listed, but only abnormal results are displayed) Labs Reviewed  BASIC METABOLIC PANEL - Abnormal; Notable for the following:       Result Value   Glucose, Bld 121 (*)    All other components within normal limits  CBC  LIPID PANEL  COMPREHENSIVE METABOLIC PANEL  RAPID URINE DRUG SCREEN, HOSP PERFORMED  URINALYSIS, ROUTINE W REFLEX MICROSCOPIC  TSH  MAGNESIUM  I-STAT TROPONIN, ED  I-STAT TROPONIN, ED    EKG  EKG Interpretation  Date/Time:  Wednesday May 08 2017 14:10:10 EDT Ventricular Rate:  86 PR Interval:  180 QRS Duration: 106 QT Interval:  378 QTC Calculation: 452 R Axis:   -72 Text Interpretation:  Sinus rhythm with frequent Premature ventricular complexes Possible Left atrial enlargement Incomplete right bundle branch block Left anterior fascicular block Cannot rule out Inferior infarct (masked by fascicular block?) , age undetermined Abnormal ECG Confirmed by Fredia Sorrow [phone]) on 05/08/2017 3:54:51 PM       Radiology Dg Chest 2 View  Result Date: 05/08/2017 CLINICAL DATA:  Mid chest pain radiating into the back and to the right jaw all earlier today associated with mild shortness of breath. Symptoms have since abated. No known cardiopulmonary issues. History of gastroesophageal reflux, never smoked. EXAM: CHEST  2 VIEW COMPARISON:  Chest x-ray of April 10, 2012 FINDINGS: The lungs are  well-expanded. There is no focal infiltrate. The interstitial markings are coarse but not greatly changed from the previous study. The heart and pulmonary vascularity are normal. The mediastinum is normal in width. The bony thorax is unremarkable. IMPRESSION: Mild interstitial prominence of both lungs slightly more conspicuous than in the past. This may reflect chronic bronchitic change. No alveolar pneumonia, CHF, nor other acute cardiopulmonary abnormality. Electronically Signed   By: David  Martinique M.D.   On: 05/08/2017 14:37    Procedures Procedures (including critical care time)   Initial Impression / Assessment and Plan / ED Course  I have reviewed the triage vital signs and the nursing notes.  Pertinent labs & imaging results that were available during my care of the patient were reviewed by me and considered in my medical decision making (see chart for details).     Vitals:   05/08/17  1412 05/08/17 1530 05/08/17 1600 05/08/17 1827  BP: 137/87 (!) 119/96 127/84 (!) 137/95  Pulse: 91 62 60 60  Resp: 18 17 18 18   Temp: 97.8 F (36.6 C)     TempSrc: Oral     SpO2: 98% 96% 96% 98%    KEASTON PILE is 56 y.o. male presenting with Acute onset of 3 out of 10 chest pain onset at 1:30 PM at rest radiating to the back and then eventually to the right jaw, it resolved spontaneously after 30 minutes. He is chest pain-free at this time. Patient is moderate risk by heart score. Prior EKG with right bundle but there are new PACs. Patient will need admission for chest pain rule out, initially patient is reticent but after extensive discussion he agrees for chest pain observation stay. He remains chest pain-free at this time.  This is a shared visit with the attending physician who personally evaluated the patient and agrees with the care plan.   Discussed with Dr. Erlinda Hong who accepts admission, she requests that cardiology be consulted.  Discussed with cardiology fellow and made them aware that  this patient would be admitted for chest pain observation and a stress test might be beneficial over the course of their stay.   Final Clinical Impressions(s) / ED Diagnoses   Final diagnoses:  Chest pain, unspecified type    New Prescriptions New Prescriptions   No medications on file     Waynetta Pean 05/08/17 Yevette Edwards    Fredia Sorrow, MD 05/09/17 [phone]

## 2017-05-08 NOTE — H&P (Signed)
History and Physical  Charles Doyle GLO:[id number] DOB: 1960-11-20 DOA: 05/08/2017  Referring physician: EDP PCP: Charles Rail, MD   Chief Complaint: chest pain rule out  HPI: Charles Doyle is a 56 y.o. male   With h/o HLD, GERD presented to the ED due to chest pain. He reports chest pain started at 1:30pm with substernal chest pain radiate to back and jaw.  he took two baby aspirins, by the time he arrived to the ED, the chest pain has resolved. Patient 's vital is stable, First set of troponin negative.   EDP called hospitalist to admit the patient for chest pain rule out. EDP is to call cardiology.    Review of Systems:  Detail per HPI, Review of systems are otherwise negative  Past Medical History:  Diagnosis Date  . GERD (gastroesophageal reflux disease)    occ  . Hyperlipidemia    prev on Niacin when 240#, now diet controlled   Past Surgical History:  Procedure Laterality Date  . APPENDECTOMY  1994  . TONSILLECTOMY  1973   Social History:  reports that he has never smoked. He has never used smokeless tobacco. He reports that he drinks about 4.2 oz of alcohol per week . He reports that he does not use drugs. Patient lives at home & is able to participate in activities of daily living independently   No Known Allergies  Family History  Problem Relation Age of Onset  . Hyperlipidemia Father   . Colon polyps Father   . Arrhythmia Brother 45       ablation x 3 (?SVT vs AF)  . Hyperlipidemia Brother   . Hyperlipidemia Sister   . Coronary artery disease Paternal Grandfather 15       sudden MI  . Arrhythmia Cousin 67       sudden death      Prior to Admission medications   Not on File    Physical Exam: BP 127/84   Pulse 60   Temp 97.8 F (36.6 C) (Oral)   Resp 18   SpO2 96%   General:  NAD Eyes: PERRL ENT: unremarkable Neck: supple, no JVD Cardiovascular: RRR Respiratory: CTABL Abdomen: soft/ND/ND, positive bowel sounds Skin: no  rash Musculoskeletal:  No edema Psychiatric: calm/cooperative Neurologic: no focal findings            Labs on Admission:  Basic Metabolic Panel:  Recent Labs Lab 05/08/17 1415  NA 139  K 4.3  CL 104  CO2 25  GLUCOSE 121*  BUN 18  CREATININE 1.22  CALCIUM 9.4   Liver Function Tests: No results for input(s): AST, ALT, ALKPHOS, BILITOT, PROT, ALBUMIN in the last 168 hours. No results for input(s): LIPASE, AMYLASE in the last 168 hours. No results for input(s): AMMONIA in the last 168 hours. CBC:  Recent Labs Lab 05/08/17 1415  WBC 7.1  HGB 14.6  HCT 43.4  MCV 87.1  PLT 179   Cardiac Enzymes: No results for input(s): CKTOTAL, CKMB, CKMBINDEX, TROPONINI in the last 168 hours.  BNP (last 3 results) No results for input(s): BNP in the last 8760 hours.  ProBNP (last 3 results) No results for input(s): PROBNP in the last 8760 hours.  CBG: No results for input(s): GLUCAP in the last 168 hours.  Radiological Exams on Admission: Dg Chest 2 View  Result Date: 05/08/2017 CLINICAL DATA:  Mid chest pain radiating into the back and to the right jaw all earlier today associated with mild shortness of  breath. Symptoms have since abated. No known cardiopulmonary issues. History of gastroesophageal reflux, never smoked. EXAM: CHEST  2 VIEW COMPARISON:  Chest x-ray of April 10, 2012 FINDINGS: The lungs are well-expanded. There is no focal infiltrate. The interstitial markings are coarse but not greatly changed from the previous study. The heart and pulmonary vascularity are normal. The mediastinum is normal in width. The bony thorax is unremarkable. IMPRESSION: Mild interstitial prominence of both lungs slightly more conspicuous than in the past. This may reflect chronic bronchitic change. No alveolar pneumonia, CHF, nor other acute cardiopulmonary abnormality. Electronically Signed   By: David  Martinique M.D.   On: 05/08/2017 14:37    EKG: Independently reviewed. RBBB which appears to  be chronic, pvc's  Assessment/Plan Present on Admission: **None**  Chest pain: - reported started at 1:30pm with substernal chest pain radiate to back and jaw, he took two baby aspirins, by the time he arrived to the ED, the chest pain has resolved -Risk factors including hyperlipidemia (last ldl in 2016 was 169 with hdl 72), report grandfather died from heart attack in his 48's, two brothers has afib -cxr" Mild interstitial prominence of both lungs slightly more conspicuous than in the past. This may reflect chronic bronchitic change. No alveolar pneumonia, CHF, nor other acute cardiopulmonary abnormality." patient denies sob, no hypoxia, no cough, no fever. -ekg with RBBB seems to be chronic, first troponin negative, will continue cycle troponin, keep on tele, keep npo aftermidnight in case of stress test in am -EDP to call cardiology  pvc's  Continue tele Check tsh/mag  HLD Report h/o niacin intolerance,  He is reluctant to start statin  will repeat lipid panel, advised to try apple sauce or asa with niacin to allievate niacin flush   H/o acid reflux, GERD -Report related to eating greasy food -will check lft, consider abdominal US to r/o gallbladder issues, patient prefers to have it done on outpatient basis due to current observation status,  He will need to follow up with pmd for this      DVT prophylaxis: lovenox  Consultants: EDP to call cardiology  Code Status: full   Family Communication:  Patient   Disposition Plan: med tele/obs  Time spent: 31mins  Myran Arcia MD, PhD [facility] Pager [phone] If 7PM-7AM, please contact night-coverage at [email or web address], password [facility]

## 2017-05-08 NOTE — ED Notes (Signed)
No chest pain at present. 

## 2017-05-08 NOTE — Consult Note (Signed)
Cardiology Consult    Patient ID: Charles Doyle MRN: [id number], DOB/AGE: [birth year]   Admit date: 05/08/2017 Date of Consult: 05/08/2017  Primary Physician: Binnie Rail, MD Primary Cardiologist: Prev seen by P. Johnsie Cancel, MD (2013) Requesting Provider: Dillard Cannon, MD  Patient Profile    Charles Doyle is a 56 y.o. male with a history of GERD and HL, who is being seen today for the evaluation of chest pain  at the request of Dr. Erlinda Hong.  Past Medical History   Past Medical History:  Diagnosis Date  . Chest pain    a. 03/2013 Ex MV:  No ischemia, nl EF;  b. 04/2012 St Echo: walked into stage 3. EF 60% @ rest, 80% w/ exercise.  No ST/T changes or WMA.  Marland Kitchen GERD (gastroesophageal reflux disease)    occ  . Hyperlipidemia    prev on Niacin when 240 lbs, now diet controlled.    Past Surgical History:  Procedure Laterality Date  . APPENDECTOMY  1994  . TONSILLECTOMY  1973     Allergies  No Known Allergies  History of Present Illness    56 year old male with a history of GERD and hyperlipidemia. He does have a history of chest pain dating back to 2004 @ which time he underwent stress testing in [place], which was nl.  In August 2013, he was seen in the ED with chest pain and nl troponins.  He was later evaluated by Dr. Johnsie Cancel with stress echocardiography, which was normal. Patient says in looking back now, he realizes that that was just severe indigestion. Over the years, he has been intermittently treated with PPI therapy. He found that with weight loss, he could control his symptoms better and came off of PPI. Currently, he uses Tums on a daily basis related to indigestion symptoms that occur after meals. He is fairly active and has been exercising regularly without symptoms or limitations. He works in Librarian, academic for [employer].  He was in his usual state of health until earlier this afternoon, approximately one hour after eating lunch, he was sitting at his desk and developed substernal  chest discomfort and pressure that radiated through to his back and up to his right jaw. There were no associated symptoms. He alerted coworkers and was evaluated by one of his nurse coworkers. He was given an aspirin and symptoms resolved within 30 minutes. He was advised to present to the emergency department. Here, ECG showed an incomplete right bundle branch block, which is old, and frequent PVCs. Troponin has been normal at 0.002. He has had no recurrence of chest pain and is eager to be discharged.  Home Medications      Prior to Admission medications   Medication Sig Start Date End Date Taking? Authorizing Provider  aspirin EC 81 MG tablet Take 162 mg by mouth once as needed (for chest pain).   Yes [provider]  calcium carbonate (TUMS - DOSED IN MG ELEMENTAL CALCIUM) 500 MG chewable tablet Chew 1 tablet by mouth 2 (two) times daily as needed for indigestion or heartburn.   Yes [provider]    Family History    Family History  Problem Relation Age of Onset  . Hyperlipidemia Father   . Colon polyps Father   . Hyperlipidemia Brother   . Atrial fibrillation Brother 45       ablation x 3 (Afib)  . Other Mother        Killed in [place] by drunk driver.  Marland Kitchen  Hyperlipidemia Sister   . Coronary artery disease Paternal Grandfather 68       sudden MI  . Sudden death Cousin 60    Social History    Social History   Social History  . Marital status: Married    Spouse name: N/A  . Number of children: N/A  . Years of education: N/A   Occupational History  .      IT professional   Social History Main Topics  . Smoking status: Never Smoker  . Smokeless tobacco: Never Used  . Alcohol use 4.2 oz/week    7 Standard drinks or equivalent per week     Comment: 2, 12 oz beers/night (7.3% ABV)  . Drug use: No  . Sexual activity: Not on file   Other Topics Concern  . 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  Cardiovascular:  +++ chest pain leading to ER presentation today, no dyspnea on exertion, edema, orthopnea, palpitations, paroxysmal nocturnal dyspnea. Dermatological: No rash, lesions/masses Respiratory: No cough, dyspnea Urologic: No hematuria, dysuria Abdominal:   +++ GERD/indigestion on a daily basis.  No nausea, vomiting, diarrhea, bright red blood per rectum, melena, or hematemesis Neurologic:  No visual changes, wkns, changes in mental status. All other systems reviewed and are otherwise negative except as noted above.  Physical Exam    Blood pressure (!) 137/95, pulse 60, temperature 97.8 F (36.6 C), temperature source Oral, resp. rate 18, SpO2 98 %.  General: Pleasant, NAD Psych: Normal affect. Neuro: Alert and oriented X 3. Moves all extremities spontaneously. HEENT: Normal  Neck: Supple without bruits or JVD. Lungs:  Resp regular and unlabored, CTA. Heart: RRR no s3, s4, or murmurs. Abdomen: Soft, non-tender, non-distended, BS + x 4.  Extremities: No clubbing, cyanosis or edema. DP/PT/Radials 2+ and equal bilaterally.  Labs    Troponin [facility] of Care Test)  Recent Labs  05/08/17 1747  TROPIPOC 0.00    Lab Results  Component Value Date   WBC 7.1 05/08/2017   HGB 14.6 05/08/2017   HCT 43.4 05/08/2017   MCV 87.1 05/08/2017   PLT 179 05/08/2017     Recent Labs Lab 05/08/17 1415  NA 139  K 4.3  CL 104  CO2 25  BUN 18  CREATININE 1.22  CALCIUM 9.4  GLUCOSE 121*   Lab Results  Component Value Date   CHOL 262 (H) 09/14/2014   HDL 72.90 09/14/2014   LDLCALC 169 (H) 09/14/2014   TRIG 103.0 09/14/2014     Radiology Studies    Dg Chest 2 View  Result Date: 05/08/2017 CLINICAL DATA:  Mid chest pain radiating into the back and to the right jaw all earlier today associated with mild shortness of breath.  Symptoms have since abated. No known cardiopulmonary issues. History of gastroesophageal reflux, never smoked. EXAM: CHEST  2 VIEW COMPARISON:  Chest x-ray of April 10, 2012 FINDINGS: The lungs are well-expanded. There is no focal infiltrate. The interstitial markings are coarse but not greatly changed from the previous study. The heart and pulmonary vascularity are normal. The mediastinum is normal in width. The bony thorax is unremarkable. IMPRESSION: Mild interstitial prominence of both lungs slightly more conspicuous than in the past. This may  reflect chronic bronchitic change. No alveolar pneumonia, CHF, nor other acute cardiopulmonary abnormality. Electronically Signed   By: David  Martinique M.D.   On: 05/08/2017 14:37    ECG & Cardiac Imaging    Regular sinus rhythm, 86, frequent PVCs, left axis deviation, incomplete right bundle branch block, no acute changes.  Assessment & Plan    1. Midsternal chest pain: Patient presented to the emergency department today after experiencing a 30 minute episode of midsternal chest discomfort that radiated through to his back and up to his jaw. Symptoms resolved after approximately 30 minutes and aspirin. In the emergency department, his ECG is notable for an old incomplete right bundle block with frequent PVCs. His second troponin has just returned normal, nearly 6 hours after onset of symptoms. He has been symptom free in the emergency department and is eager for discharge. Recommend ambulation and if no recurrent symptoms, he can be safely discharged from the emergency department. We will arrange for outpatient cardiac CT angiography and office follow-up. He should continue on aspirin therapy.  2.  HL:  LDL 169 in 2016.  He will need f/u fasting lipids.  If CTA suggests any amt of CAD, he will need statin Rx.  3.  GERD:  Long h/o indigestion.  He previously used PPI but stopped it b/c when on it, he could tolerate more foods and as a result would gain wt.  He  says that he's controlling his Ss with wt loss and prn tums, but he actually takes tums daily.  He would benefit from resumption of daily PPI therapy.  Signed, Murray Hodgkins, NP 05/08/2017, 7:34 PM   I have seen and examined the patient along with Murray Hodgkins, NP.  I have reviewed the chart, notes and new data.  I agree with NP's note.  Key new complaints: The bulk of clinical data suggests noncardiac cause for his chest pain. He is able to exercise by running 3 days a week without discomfort and had chest discomfort today roughly 1 hour after a meal. Symptoms improved with aspirin. Key examination changes: Other than occasional PVCs and a widely split second heart sound to the right bundle branch block he has a normal cardiovascular exam. All peripheral pulses are normal and there are no arterial bruits. There is no evidence of hypervolemia/heart failure, he has no cardiac murmurs. Key new findings / data: ECG shows pre-existing right bundle branch block and left anterior fascicular block. The rhythm is normal sinus with very frequent PVCs. On telemetry he now has very rare PVCs and is resting heart rate is in the 50s. Most recent lipid profile is significant for an LDL cholesterol 169, but also an excellent HDL of 73. He is not taking lipid lowering drugs and expresses a desire to avoid statins if possible.  PLAN: I think his risk of a major cardiovascular event in the next few weeks is low (symptoms have resolved, low risk ECG, normal cardiac enzymes).  Recommend outpatient coronary CT angiography. This would serve to exclude significant coronary artery disease, but also give [place] a calcium score which should help in the decision to provide statin therapy. I personally think he should be taking statins, but if we show that he has a high calcium score I think this would also provide him enough motivation to take lipid-lowering medications.  Charles Klein, MD, [place] [phone] 05/08/2017, 8:22 PM

## 2017-05-08 NOTE — ED Provider Notes (Signed)
Medical screening examination/treatment/procedure(s) were conducted as a shared visit with non-physician practitioner(s) and myself.  I personally evaluated the patient during the encounter.   EKG Interpretation  Date/Time:  Wednesday May 08 2017 14:10:10 EDT Ventricular Rate:  86 PR Interval:  180 QRS Duration: 106 QT Interval:  378 QTC Calculation: 452 R Axis:   -72 Text Interpretation:  Sinus rhythm with frequent Premature ventricular complexes Possible Left atrial enlargement Incomplete right bundle branch block Left anterior fascicular block Cannot rule out Inferior infarct (masked by fascicular block?) , age undetermined Abnormal ECG Confirmed by Fredia Sorrow [phone]) on 05/08/2017 3:54:51 PM       Results for orders placed or performed during the hospital encounter of 54/62/70  Basic metabolic panel  Result Value Ref Range   Sodium 139 135 - 145 mmol/L   Potassium 4.3 3.5 - 5.1 mmol/L   Chloride 104 101 - 111 mmol/L   CO2 25 22 - 32 mmol/L   Glucose, Bld 121 (H) 65 - 99 mg/dL   BUN 18 6 - 20 mg/dL   Creatinine, Ser 1.22 0.61 - 1.24 mg/dL   Calcium 9.4 8.9 - 10.3 mg/dL   GFR calc non Af Amer >60 >60 mL/min   GFR calc Af Amer >60 >60 mL/min   Anion gap 10 5 - 15  CBC  Result Value Ref Range   WBC 7.1 4.0 - 10.5 K/uL   RBC 4.98 4.22 - 5.81 MIL/uL   Hemoglobin 14.6 13.0 - 17.0 g/dL   HCT 43.4 39.0 - 52.0 %   MCV 87.1 78.0 - 100.0 fL   MCH 29.3 26.0 - 34.0 pg   MCHC 33.6 30.0 - 36.0 g/dL   RDW 13.4 11.5 - 15.5 %   Platelets 179 150 - 400 K/uL  I-stat troponin, ED  Result Value Ref Range   Troponin i, poc 0.00 0.00 - 0.08 ng/mL   Comment 3           Dg Chest 2 View  Result Date: 05/08/2017 CLINICAL DATA:  Mid chest pain radiating into the back and to the right jaw all earlier today associated with mild shortness of breath. Symptoms have since abated. No known cardiopulmonary issues. History of gastroesophageal reflux, never smoked. EXAM: CHEST  2 VIEW  COMPARISON:  Chest x-ray of April 10, 2012 FINDINGS: The lungs are well-expanded. There is no focal infiltrate. The interstitial markings are coarse but not greatly changed from the previous study. The heart and pulmonary vascularity are normal. The mediastinum is normal in width. The bony thorax is unremarkable. IMPRESSION: Mild interstitial prominence of both lungs slightly more conspicuous than in the past. This may reflect chronic bronchitic change. No alveolar pneumonia, CHF, nor other acute cardiopulmonary abnormality. Electronically Signed   By: David  Martinique M.D.   On: 05/08/2017 14:37    The patient seen by me along with the physician assistant. Patient with acute onset of chest pain at about 130s afternoon lasted for 30 minutes and then resolved. Patient has a history of reflux disease but states this was very different. Pain did radiate to his back and to the right jaw. So she was some mild shortness of breath. No shortness of breath now.  Patient's initial troponin was negative. Based on his heart score he has a moderate risk and therefore will require rule out cardiac admission overnight. If negative he can follow back up with cardiology may be a good candidate for stress test. Patient currently is asymptomatic.  Heart regular lungs  clear bilaterally abdomen soft and nontender.  EKG showed evidence of right bundle branch block. He is had that in the past. But did have a lot of faith PVCs. No acute cardiac changes.    Fredia Sorrow, MD 05/08/17 [phone]

## 2017-05-12 NOTE — Progress Notes (Signed)
Subjective:    Patient ID: Charles Doyle, male    DOB: [birth year], 56 y.o.   MRN: [id number]  HPI The patient is here for follow up from the hospital.   He went to the ED 9/5 for chest pain.    He has a history of GERD and hyperlipidemia not on medication and presented with chest tightness 4/10 in intensity that radiated to his back and jaw.  It started at rest.  He had associated lightheadedness, nausea.  He was not SOB or diaphoretic.  The chest pain resolved spontaneously in 30 minutes.  He denies prior episodes that felt similar and this felt different than his GERD episodes.  His vitals and exam were normal.  His EKG showed NSR with frequent PVCs.  Possible LAE, incomplete RBBB, LAFB, can not rule out inferior infarct.  His troponin x 2 were negative, BMP and CBC were normal, UA was negative.  Urine drug screen was negative.  CXR showed possible chronic bronchitic changes, otherwise normal.  He was started on lipitor 20 mg daily.  He has a follow up with cardiology on 9/11.   He exercises 2-3 times a week, running 1-2 miles.  He also some weights.  One time he had chest tightness with exercise.  He denies other chest pain - none since being in the ED.    GERD:  He has GERD whenever he eats bad, which is frequent.  He takes Tums prn.  If he takes prilosec he has no GERD.   Hyperlipidemia:  He is not on medication and wants to avoid a statin.  He has taken niacin in the past.  He is not compliant with a low fat/cholesterol diet.     Medications and allergies reviewed with patient and updated if appropriate.  Patient Active Problem List   Diagnosis Date Noted  . Chest pain 05/08/2017  . Cataract 09/23/2015  . Basal cell carcinoma 09/23/2015  . Elevated cholesterol 04/14/2012  . RBBB 04/14/2012  . Murmur 04/14/2012  . GERD (gastroesophageal reflux disease) 04/14/2012    Current Outpatient Prescriptions on File Prior to Visit  Medication Sig Dispense Refill  . aspirin EC 81  MG tablet Take 1 tablet (81 mg total) by mouth daily. 30 tablet 0  . calcium carbonate (TUMS - DOSED IN MG ELEMENTAL CALCIUM) 500 MG chewable tablet Chew 1 tablet by mouth 2 (two) times daily as needed for indigestion or heartburn.    Marland Kitchen atorvastatin (LIPITOR) 20 MG tablet Take 1 tablet (20 mg total) by mouth daily. (Patient not taking: Reported on 05/13/2017) 30 tablet 0   No current facility-administered medications on file prior to visit.     Past Medical History:  Diagnosis Date  . Cataract 09/23/2015  . Chest pain    a. 03/2013 Ex MV:  No ischemia, nl EF;  b. 04/2012 St Echo: walked into stage 3. EF 60% @ rest, 80% w/ exercise.  No ST/T changes or WMA.  . Elevated cholesterol 04/14/2012  . GERD (gastroesophageal reflux disease)    occ  . Hyperlipidemia    prev on Niacin when 240 lbs, now diet controlled.  . Murmur 04/14/2012  . RBBB 04/14/2012    Past Surgical History:  Procedure Laterality Date  . APPENDECTOMY  1994  . TONSILLECTOMY  1973    Social History   Social History  . Marital status: Married    Spouse name: N/A  . Number of children: N/A  . Years of education: N/A  Occupational History  .      IT professional   Social History Main Topics  . Smoking status: Never Smoker  . Smokeless tobacco: Never Used  . Alcohol use 4.2 oz/week    7 Standard drinks or equivalent per week     Comment: 2, 12 oz beers/night (7.3% ABV)  . Drug use: No  . Sexual activity: Not Asked   Other Topics Concern  . 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 Hyperlipidemia Father   . Colon polyps Father   . Hyperlipidemia Brother   . Atrial fibrillation Brother 45       ablation x 3 (Afib)  . Other Mother        Killed in [place] by drunk driver.  . Hyperlipidemia Sister   . Coronary artery disease Paternal Grandfather 14         sudden MI  . Sudden death Cousin 75    Review of Systems  Constitutional: Negative for chills and fever.  Respiratory: Negative for cough, shortness of breath and wheezing.   Cardiovascular: Positive for palpitations (woke up two nights ago with palps). Negative for chest pain and leg swelling.  Gastrointestinal: Negative for abdominal pain, blood in stool, constipation, diarrhea and nausea.  Neurological: Negative for dizziness, light-headedness and headaches.       Objective:   Vitals:   05/13/17 1311  BP: 104/76  Pulse: 71  Resp: 16  Temp: 97.7 F (36.5 C)  SpO2: 98%   Wt Readings from Last 3 Encounters:  05/13/17 223 lb (101.2 kg)  09/23/15 219 lb (99.3 kg)  09/14/14 212 lb 8 oz (96.4 kg)   Body mass index is 27.87 kg/m.   Physical Exam    Constitutional: Appears well-developed and well-nourished. No distress.  HENT:  Head: Normocephalic and atraumatic.  Neck: Neck supple. No tracheal deviation present. No thyromegaly present.  No cervical lymphadenopathy Cardiovascular: Normal rate, regular rhythm and normal heart sounds.   No murmur heard. No carotid bruit .  No edema Pulmonary/Chest: Effort normal and breath sounds normal. No respiratory distress. No has no wheezes. No rales.  Abdomen: soft, non tender, no distention, no HSM Skin: Skin is warm and dry. Not diaphoretic.  Psychiatric: Normal mood and affect. Behavior is normal.   DG Chest 2 View CLINICAL DATA:  Mid chest pain radiating into the back and to the right jaw all earlier today associated with mild shortness of breath. Symptoms have since abated. No known cardiopulmonary issues. History of gastroesophageal reflux, never smoked.  EXAM: CHEST  2 VIEW  COMPARISON:  Chest x-ray of April 10, 2012  FINDINGS: The lungs are well-expanded. There is no focal infiltrate. The interstitial markings are coarse but not greatly changed from the previous study. The heart and pulmonary vascularity are  normal. The mediastinum is normal in width. The bony thorax is unremarkable.  IMPRESSION: Mild interstitial prominence of both lungs slightly more conspicuous than in the past. This may reflect chronic bronchitic change. No alveolar pneumonia, CHF, nor other acute cardiopulmonary abnormality.  Electronically Signed   By: David  Martinique M.D.   On: 05/08/2017 14:37     Assessment & Plan:    See Problem List for Assessment and Plan of chronic medical problems.

## 2017-05-12 NOTE — Patient Instructions (Addendum)
  Test(s) ordered today. Your results will be released to [place] (or called to you) after review, usually within 72hours after test completion. If any changes need to be made, you will be notified at that same time.  All other Health Maintenance issues reviewed.   All recommended immunizations and age-appropriate screenings are up-to-date or discussed.  flu immunization administered today.   Medications reviewed and updated. No changes recommended at this time.  A referral was ordered for GI for a colonoscopy.  An ultrasound of your gallbladder was ordered.

## 2017-05-13 ENCOUNTER — Ambulatory Visit (INDEPENDENT_AMBULATORY_CARE_PROVIDER_SITE_OTHER): Payer: 59 | Admitting: Internal Medicine

## 2017-05-13 ENCOUNTER — Encounter: Payer: Self-pay | Admitting: Internal Medicine

## 2017-05-13 ENCOUNTER — Ambulatory Visit: Payer: 59 | Admitting: Cardiology

## 2017-05-13 VITALS — BP 104/76 | HR 71 | Temp 97.7°F | Resp 16 | Wt 223.0 lb

## 2017-05-13 DIAGNOSIS — K219 Gastro-esophageal reflux disease without esophagitis: Secondary | ICD-10-CM

## 2017-05-13 DIAGNOSIS — Z1211 Encounter for screening for malignant neoplasm of colon: Secondary | ICD-10-CM | POA: Diagnosis not present

## 2017-05-13 DIAGNOSIS — Z125 Encounter for screening for malignant neoplasm of prostate: Secondary | ICD-10-CM

## 2017-05-13 DIAGNOSIS — E7800 Pure hypercholesterolemia, unspecified: Secondary | ICD-10-CM

## 2017-05-13 DIAGNOSIS — R072 Precordial pain: Secondary | ICD-10-CM | POA: Diagnosis not present

## 2017-05-13 NOTE — Assessment & Plan Note (Signed)
Stressed dietary changes Takes tums prn Encourage pre-medication if he is going to eat badly Stressed getting better control of GERD

## 2017-05-13 NOTE — Progress Notes (Signed)
Cardiology Office Note:    Date:  05/14/2017   ID:  Charles Doyle, DOB 10-03-1960, MRN [id number]  PCP:  Binnie Rail, MD  Cardiologist:  Shirlee More, MD    Referring MD: Binnie Rail, MD    ASSESSMENT:    1. Chest pain in adult    PLAN:    In order of problems listed above:  1. Atypical likely biliary he'll undergo repeat ischemia evaluation cardiac CTA and decision regarding advisability of statin therapy   Next appointment: 4 weeks   Medication Adjustments/Labs and Tests Ordered: Current medicines are reviewed at length with the patient today.  Concerns regarding medicines are outlined above.  Orders Placed This Encounter  Procedures  . CT CORONARY MORPH W/CTA COR W/SCORE W/CA W/CM &/OR WO/CM  . CT CORONARY FRACTIONAL FLOW RESERVE DATA PREP  . CT CORONARY FRACTIONAL FLOW RESERVE FLUID ANALYSIS   No orders of the defined types were placed in this encounter.   Chief Complaint  Patient presents with  . New Patient (Initial Visit)    per Dr Rogene Houston to evaluate CP  . Chest Pain    x 1 week  . Palpitations    History of Present Illness:    Charles Doyle is a 56 y.o. male with a hx of GERD and hyperlipidemia with recent [facility] stay with chest pain, stable EKG and normal Troponin  last seen 05/08/17.  Since discharge he's had no further cardiovascular symptoms. He has no history of exercise intolerance and exertional chest pain shortness of breath palpitation syncope or TIA. He is episode of substernal chest pain that radiated through to the back and the right jaw was unassociated with activity and relieved with rest and occurred after a meal. He is scheduled to undergo a duplex of his gallbladder. After discussion of noninvasive ischemia modalities he'll undergo cardiac CTA. He is hasn't take a statin if he has no evidence of coronary atherosclerosis I would not advise it. If he has high risk markers he accepts the need for coronary  Angiography. Assessment  & Plan    1. Midsternal chest pain: Patient presented to the emergency department today after experiencing a 30 minute episode of midsternal chest discomfort that radiated through to his back and up to his jaw. Symptoms resolved after approximately 30 minutes and aspirin. In the emergency department, his ECG is notable for an old incomplete right bundle block with frequent PVCs. His second troponin has just returned normal, nearly 6 hours after onset of symptoms. He has been symptom free in the emergency department and is eager for discharge. Recommend ambulation and if no recurrent symptoms, he can be safely discharged from the emergency department. We will arrange for outpatient cardiac CT angiography and office follow-up. He should continue on aspirin therapy. 2.  HL:  LDL 169 in 2016.  He will need f/u fasting lipids.  If CTA suggests any amt of CAD, he will need statin Rx. 3.  GERD:  Long h/o indigestion.  He previously used PPI but stopped it b/c when on it, he could tolerate more foods and as a result would gain wt.  He says that he's controlling his Ss with wt loss and prn tums, but he actually takes tums daily.  He would benefit from resumption of daily PPI therapy. Signed, Murray Hodgkins, NP 05/08/2017, 7:34 PM . Compliance with diet, lifestyle and medications: no he did not initiate a statin Past Medical History:  Diagnosis Date  . Cataract 09/23/2015  .  Chest pain    a. 03/2013 Ex MV:  No ischemia, nl EF;  b. 04/2012 St Echo: walked into stage 3. EF 60% @ rest, 80% w/ exercise.  No ST/T changes or WMA.  . Elevated cholesterol 04/14/2012  . GERD (gastroesophageal reflux disease)    occ  . Hyperlipidemia    prev on Niacin when 240 lbs, now diet controlled.  . Murmur 04/14/2012  . RBBB 04/14/2012    Past Surgical History:  Procedure Laterality Date  . APPENDECTOMY  1994  . TONSILLECTOMY  1973    Current Medications: Current Meds  Medication Sig  . aspirin EC 81 MG tablet Take 1  tablet (81 mg total) by mouth daily.  . calcium carbonate (TUMS - DOSED IN MG ELEMENTAL CALCIUM) 500 MG chewable tablet Chew 1 tablet by mouth 2 (two) times daily as needed for indigestion or heartburn.     Allergies:   Patient has no known allergies.   Social History   Social History  . Marital status: Married    Spouse name: N/A  . Number of children: N/A  . Years of education: N/A   Occupational History  .      IT professional   Social History Main Topics  . Smoking status: Never Smoker  . Smokeless tobacco: Never Used  . Alcohol use 4.2 oz/week    7 Standard drinks or equivalent per week     Comment: 2, 12 oz beers/night (7.3% ABV)  . Drug use: No  . Sexual activity: Not Asked   Other Topics Concern  . 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 ROS:   Please see the history of present illness.    All other systems reviewed and are negative.  EKGs/Labs/Other Studies Reviewed:    The following studies were reviewed today:  EKG:  Inpatient right bundle branch block left anterior hemiblock stable pattern  Recent Labs: 05/08/2017: BUN 18; Creatinine, Ser 1.22; Hemoglobin 14.6; Platelets 179; Potassium 4.3; Sodium 139  Recent Lipid Panel    Component Value Date/Time   CHOL 262 (H) 09/14/2014 1102   TRIG 103.0 09/14/2014 1102   HDL 72.90 09/14/2014 1102   CHOLHDL 4 09/14/2014 1102   VLDL 20.6 09/14/2014 1102   LDLCALC 169 (H) 09/14/2014 1102    Physical Exam:    VS:  BP 114/76 (BP Location: Left Arm, Patient Position: Sitting)    Pulse 63   Ht 6\' 3"  (1.905 m)   Wt 221 lb 12.8 oz (100.6 kg)   SpO2 97%   BMI 27.72 kg/m     Wt Readings from Last 3 Encounters:  05/14/17 221 lb 12.8 oz (100.6 kg)  05/13/17 223 lb (101.2 kg)  09/23/15 219 lb (99.3 kg)     GEN:  Well nourished, well developed in no acute distress HEENT: Normal NECK: No JVD; No carotid bruits LYMPHATICS: No lymphadenopathy CARDIAC: RRR, no murmurs, rubs, gallops RESPIRATORY:  Clear to auscultation without rales, wheezing or rhonchi  ABDOMEN: Soft, non-tender, non-distended MUSCULOSKELETAL:  No edema; No  deformity  SKIN: Warm and dry NEUROLOGIC:  Alert and oriented x 3 PSYCHIATRIC:  Normal affect    Signed, Shirlee More, MD  05/14/2017 10:33 AM    [place]

## 2017-05-13 NOTE — Assessment & Plan Note (Signed)
Ruled out for ACS Will see Cardio tomorrow Not taking statin Will check lipid panel Will have CT calcium scan by cardio Work on weight loss Improve eating Unlikely GB but will get a abd [place] to r/o GB disease

## 2017-05-13 NOTE — Assessment & Plan Note (Signed)
Check lipid panel Wants to avoid statin Improve diet, work on weight loss Will see cardio tomorrow

## 2017-05-14 ENCOUNTER — Encounter: Payer: Self-pay | Admitting: Internal Medicine

## 2017-05-14 ENCOUNTER — Encounter: Payer: Self-pay | Admitting: Cardiology

## 2017-05-14 ENCOUNTER — Ambulatory Visit (INDEPENDENT_AMBULATORY_CARE_PROVIDER_SITE_OTHER): Payer: 59 | Admitting: Cardiology

## 2017-05-14 VITALS — BP 114/76 | HR 63 | Ht 75.0 in | Wt 221.8 lb

## 2017-05-14 DIAGNOSIS — R079 Chest pain, unspecified: Secondary | ICD-10-CM | POA: Diagnosis not present

## 2017-05-14 NOTE — Patient Instructions (Addendum)
Medication Instructions:  Your physician recommends that you continue on your current medications as directed. Please refer to the Current Medication list given to you today.  Labwork: None  Testing/Procedures: Your physician has requested that you have cardiac CT. Cardiac computed tomography (CT) is a painless test that uses an x-ray machine to take clear, detailed pictures of your heart. For further information please visit [email or web address].   Follow-Up: Your physician recommends that you schedule a follow-up appointment in: 3 weeks.   Any Other Special Instructions Will Be Listed Below (If Applicable).     If you need a refill on your cardiac medications before your next appointment, please call your pharmacy.

## 2017-05-15 ENCOUNTER — Other Ambulatory Visit (INDEPENDENT_AMBULATORY_CARE_PROVIDER_SITE_OTHER): Payer: 59

## 2017-05-15 DIAGNOSIS — Z125 Encounter for screening for malignant neoplasm of prostate: Secondary | ICD-10-CM

## 2017-05-15 DIAGNOSIS — E7800 Pure hypercholesterolemia, unspecified: Secondary | ICD-10-CM | POA: Diagnosis not present

## 2017-05-15 LAB — COMPREHENSIVE METABOLIC PANEL
ALBUMIN: 4.2 g/dL (ref 3.5–5.2)
ALT: 13 U/L (ref 0–53)
AST: 15 U/L (ref 0–37)
Alkaline Phosphatase: 46 U/L (ref 39–117)
BILIRUBIN TOTAL: 0.5 mg/dL (ref 0.2–1.2)
BUN: 19 mg/dL (ref 6–23)
CHLORIDE: 105 meq/L (ref 96–112)
CO2: 26 meq/L (ref 19–32)
CREATININE: 1.2 mg/dL (ref 0.40–1.50)
Calcium: 9.5 mg/dL (ref 8.4–10.5)
GFR: 66.72 mL/min (ref >60.00)
Glucose, Bld: 94 mg/dL (ref 70–99)
Potassium: 4.2 mEq/L (ref 3.5–5.1)
SODIUM: 139 meq/L (ref 135–145)
Total Protein: 6.7 g/dL (ref 6.0–8.3)

## 2017-05-15 LAB — LIPID PANEL
CHOL/HDL RATIO: 3
CHOLESTEROL: 227 mg/dL — AB (ref 0–200)
HDL: 77.5 mg/dL (ref >39.00)
LDL CALC: 138 mg/dL — AB (ref 0–99)
NonHDL: 149.58
TRIGLYCERIDES: 59 mg/dL (ref 0.0–149.0)
VLDL: 11.8 mg/dL (ref 0.0–40.0)

## 2017-05-15 LAB — TSH: TSH: 1.9 u[IU]/mL (ref 0.35–4.50)

## 2017-05-16 ENCOUNTER — Encounter: Payer: Self-pay | Admitting: Internal Medicine

## 2017-05-16 LAB — PSA, TOTAL AND FREE
PSA, % Free: 50 % (calc) (ref >25)
PSA, Free: 0.2 ng/mL
PSA, Total: 0.4 ng/mL (ref <=4.0)

## 2017-05-31 ENCOUNTER — Encounter: Payer: Self-pay | Admitting: Nurse Practitioner

## 2017-06-04 ENCOUNTER — Ambulatory Visit: Payer: 59 | Admitting: Cardiology

## 2017-06-06 ENCOUNTER — Ambulatory Visit: Payer: 59 | Admitting: Cardiology

## 2017-06-07 ENCOUNTER — Ambulatory Visit
Admission: RE | Admit: 2017-06-07 | Discharge: 2017-06-07 | Disposition: A | Discharge status: Home / Self Care | Payer: 59 | Source: Ambulatory Visit | Attending: Internal Medicine | Admitting: Internal Medicine

## 2017-06-07 ENCOUNTER — Ambulatory Visit: Payer: 59 | Admitting: Cardiology

## 2017-06-07 DIAGNOSIS — K7689 Other specified diseases of liver: Secondary | ICD-10-CM | POA: Diagnosis not present

## 2017-06-07 DIAGNOSIS — R072 Precordial pain: Secondary | ICD-10-CM

## 2017-06-08 ENCOUNTER — Encounter: Payer: Self-pay | Admitting: Internal Medicine

## 2017-06-13 ENCOUNTER — Ambulatory Visit (HOSPITAL_COMMUNITY)
Admission: RE | Admit: 2017-06-13 | Discharge: 2017-06-13 | Disposition: A | Discharge status: Home / Self Care | Payer: 59 | Source: Ambulatory Visit | Attending: Nurse Practitioner | Admitting: Nurse Practitioner

## 2017-06-13 DIAGNOSIS — R079 Chest pain, unspecified: Secondary | ICD-10-CM | POA: Diagnosis not present

## 2017-06-13 DIAGNOSIS — R072 Precordial pain: Secondary | ICD-10-CM | POA: Insufficient documentation

## 2017-06-13 MED ORDER — METOPROLOL TARTRATE 5 MG/5ML IV SOLN
INTRAVENOUS | Status: AC
  Filled 2017-06-13: qty 5

## 2017-06-13 MED ORDER — IOPAMIDOL (ISOVUE-370) INJECTION 76%
INTRAVENOUS | Status: AC
  Administered 2017-06-13: 80 mL via INTRAVENOUS
  Filled 2017-06-13: qty 100

## 2017-06-13 MED ORDER — METOPROLOL TARTRATE 5 MG/5ML IV SOLN
5.0000 mg | Freq: Once | INTRAVENOUS | Status: AC
  Administered 2017-06-13: 5 mg via INTRAVENOUS

## 2017-06-13 MED ORDER — NITROGLYCERIN 0.4 MG SL SUBL
SUBLINGUAL_TABLET | SUBLINGUAL | Status: AC
  Filled 2017-06-13: qty 2

## 2017-06-13 MED ORDER — NITROGLYCERIN 0.4 MG SL SUBL
0.8000 mg | SUBLINGUAL_TABLET | Freq: Once | SUBLINGUAL | Status: AC
  Administered 2017-06-13: 0.8 mg via SUBLINGUAL

## 2017-06-21 ENCOUNTER — Ambulatory Visit: Payer: 59 | Admitting: Cardiology

## 2017-06-26 NOTE — Progress Notes (Signed)
Cardiology Office Note:    Date:  06/27/2017   ID:  Charles Doyle, DOB 20-Jan-1961, MRN [id number]  PCP:  Binnie Rail, MD  Cardiologist:  Shirlee More, MD    Referring MD: Binnie Rail, MD    ASSESSMENT:    1. Myocardial bridge   2. Enlarged thoracic aorta (HCC)    PLAN:    In order of problems listed above:  1. Stable nitroglycerin as needed 2. Follow-up CT 1 year   Next appointment: One year   Medication Adjustments/Labs and Tests Ordered: Current medicines are reviewed at length with the patient today.  Concerns regarding medicines are outlined above.  No orders of the defined types were placed in this encounter.  Meds ordered this encounter  Medications  . nitroGLYCERIN (NITROSTAT) 0.4 MG SL tablet    Sig: Place 1 tablet (0.4 mg total) under the tongue every 5 (five) minutes as needed for chest pain.    Dispense:  25 tablet    Refill:  12    Dispense 2 presriptions of NTG tabs    Chief Complaint  Patient presents with  . Follow-up  . Irregular Heart Beat    History of Present Illness:    Charles Doyle is a 56 y.o. male with a hx of GERD and hyperlipidemia with recent [facility] stay with chest pain, stable EKG and normal Troponin  l last seen 05/14/17.His CCTA shows a bridge and mild enlargement of his ascending aorta.  He has had 5 episodes of chest discomfort in his adult life.  He does not have typical exertional angina.  Clinically bridges unrelated to the chest pain we have decided not to start long-term antianginal treatment and to utilize nitroglycerin if needed.  He has mild dilation of his ascending aortawill have follow-up CT 1 year Compliance with diet, lifestyle and medications: yes Past Medical History:  Diagnosis Date  . Cataract 09/23/2015  . Chest pain    a. 03/2013 Ex MV:  No ischemia, nl EF;  b. 04/2012 St Echo: walked into stage 3. EF 60% @ rest, 80% w/ exercise.  No ST/T changes or WMA.  . Elevated cholesterol 04/14/2012  . GERD  (gastroesophageal reflux disease)    occ  . Hyperlipidemia    prev on Niacin when 240 lbs, now diet controlled.  . Murmur 04/14/2012  . RBBB 04/14/2012    Past Surgical History:  Procedure Laterality Date  . APPENDECTOMY  1994  . TONSILLECTOMY  1973    Current Medications: Current Meds  Medication Sig  . calcium carbonate (TUMS - DOSED IN MG ELEMENTAL CALCIUM) 500 MG chewable tablet Chew 1 tablet by mouth 2 (two) times daily as needed for indigestion or heartburn.     Allergies:   Patient has no known allergies.   Social History   Social History  . Marital status: Married    Spouse name: N/A  . Number of children: N/A  . Years of education: N/A   Occupational History  .      IT professional   Social History Main Topics  . Smoking status: Never Smoker  . Smokeless tobacco: Never Used  . Alcohol use 4.2 oz/week    7 Standard drinks or equivalent per week     Comment: 2, 12 oz beers/night (7.3% ABV)  . Drug use: No  . Sexual activity: Not Asked   Other Topics Concern  . 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 ROS:   Please see the history of present illness.    All other systems reviewed and are negative.  EKGs/Labs/Other Studies Reviewed:    The following studies were reviewed today:  Cardiac CTA: IMPRESSION: 1. Coronary calcium score of 0. This was 0 percentile for age and sex matched control. 2. Normal coronary origin with right dominance. 3. No evidence of CAD. There is a long intramyocardial bridge in mid LAD. A  therapy with beta-blockers is recommended 4. Mildly dilated pulmonary artery measuring 33 mm suggestive of pulmonary hypertension. Radiology overread the ascending aorta at 40 mm but normal by cardiology.  Recent Labs: 05/08/2017: Hemoglobin 14.6; Platelets 179 05/15/2017: ALT 13; BUN 19; Creatinine, Ser 1.20; Potassium 4.2; Sodium 139; TSH 1.90  Recent Lipid Panel    Component Value Date/Time   CHOL 227 (H) 05/15/2017 0759   TRIG 59.0 05/15/2017 0759   HDL 77.50 05/15/2017 0759   CHOLHDL 3 05/15/2017 0759   VLDL 11.8 05/15/2017 0759   LDLCALC 138 (H) 05/15/2017 0759    Physical Exam:    VS:  BP 110/78 (BP Location: Right Arm, Patient Position: Sitting, Cuff Size: Normal)   Pulse 78   Ht 6\' 3"  (1.905 m)   Wt 223 lb (101.2 kg)   SpO2 97%   BMI 27.87 kg/m     Wt Readings from Last 3 Encounters:  06/27/17 223 lb (101.2 kg)  05/14/17 221 lb 12.8 oz (100.6 kg)  05/13/17 223 lb (101.2 kg)     GEN:  Well nourished, well developed in no acute distress HEENT: Normal NECK: No JVD; No carotid bruits LYMPHATICS: No lymphadenopathy CARDIAC: RRR, no murmurs, rubs, gallops RESPIRATORY:  Clear to auscultation without rales, wheezing or rhonchi  ABDOMEN: Soft, non-tender, non-distended MUSCULOSKELETAL:  No edema; No deformity  SKIN: Warm and dry NEUROLOGIC:  Alert and oriented x 3 PSYCHIATRIC:  Normal affect    Signed, Shirlee More, MD  06/27/2017 11:55 AM    [place]

## 2017-06-27 ENCOUNTER — Ambulatory Visit (INDEPENDENT_AMBULATORY_CARE_PROVIDER_SITE_OTHER): Payer: 59 | Admitting: Cardiology

## 2017-06-27 ENCOUNTER — Encounter: Payer: Self-pay | Admitting: Cardiology

## 2017-06-27 VITALS — BP 110/78 | HR 78 | Ht 75.0 in | Wt 223.0 lb

## 2017-06-27 DIAGNOSIS — I7789 Other specified disorders of arteries and arterioles: Secondary | ICD-10-CM | POA: Diagnosis not present

## 2017-06-27 DIAGNOSIS — Q245 Malformation of coronary vessels: Secondary | ICD-10-CM

## 2017-06-27 MED ORDER — NITROGLYCERIN 0.4 MG SL SUBL: 0.4000 mg | SUBLINGUAL_TABLET | SUBLINGUAL | 12 refills | Status: AC | PRN

## 2017-06-27 NOTE — Patient Instructions (Addendum)
Medication Instructions:  Your physician has recommended you make the following change in your medication:  START nitroglycerin 0.4 mg sublingual (under your tongue) as needed for chest pain. When having chest pain, stop what you are doing and sit down. Take 1 nitro, wait 5 minutes. Still having chest pain, take 1 nitro, wait 5 minutes. Still having chest pain, take 1 nitro, dial 911. Total of 3 nitro in 15 minutes.  Labwork: None  Testing/Procedures: None  Follow-Up: Your physician wants you to follow-up in: 1 year. You will receive a reminder letter in the mail two months in advance. If you don't receive a letter, please call our office to schedule the follow-up appointment.  Any Other Special Instructions Will Be Listed Below (If Applicable).     If you need a refill on your cardiac medications before your next appointment, please call your pharmacy.    Angina Pectoris Angina pectoris, often called angina, is extreme discomfort in the chest, neck, or arm. This is caused by a lack of blood in the middle and thickest layer of the heart wall (myocardium). There are four types of angina:  Stable angina. Stable angina usually occurs in episodes of predictable frequency and duration. It is usually brought on by physical activity, stress, or excitement. Stable angina usually lasts a few minutes and can often be relieved by a medicine that you place under your tongue. This medicine is called sublingual nitroglycerin.  Unstable angina. Unstable angina can occur even when you are doing little or no physical activity. It can even occur while you are sleeping or when you are at rest. It can suddenly increase in severity or frequency. It may not be relieved by sublingual nitroglycerin, and it can last up to 30 minutes.  Microvascular angina. This type of angina is caused by a disorder of tiny blood vessels called arterioles. Microvascular angina is more common in women. The pain may be more severe  and last longer than other types of angina pectoris.  Prinzmetal or variant angina. This type of angina pectoris is rare and usually occurs when you are doing little or no physical activity. It especially occurs in the early morning hours.  What are the causes? Atherosclerosis is the cause of angina. This is the buildup of fat and cholesterol (plaque) on the inside of the arteries. Over time, the plaque may narrow or block the artery, and this will lessen blood flow to the heart. Plaque can also become weak and break off within a coronary artery to form a clot and cause a sudden blockage. What increases the risk? Risk factors common to both men and women include:  High cholesterol levels.  High blood pressure (hypertension).  Tobacco use.  Diabetes.  Family history of angina.  Obesity.  Lack of exercise.  A diet high in saturated fats.  Women are at greater risk for angina if they are:  Over age 26.  Postmenopausal.  What are the signs or symptoms? Many people do not experience any symptoms during the early stages of angina. As the condition progresses, symptoms common to both men and women may include:  Chest pain. ? The pain can be described as a crushing or squeezing in the chest, or a tightness, pressure, fullness, or heaviness in the chest. ? The pain can last more than a few minutes, or it can stop and recur.  Pain in the arms, neck, jaw, or back.  Unexplained heartburn or indigestion.  Shortness of breath.  Nausea.  Sudden  cold sweats.  Sudden light-headedness.  Many women have chest discomfort and some of the other symptoms. However, women often have different (atypical) symptoms, such as:  Fatigue.  Unexplained feelings of nervousness or anxiety.  Unexplained weakness.  Dizziness or fainting.  Sometimes, women may have angina without any symptoms. How is this diagnosed? Tests to diagnose angina may include:  ECG (electrocardiogram).  Exercise  stress test. This looks for signs of blockage when the heart is being exercised.  Pharmacologic stress test. This test looks for signs of blockage when the heart is being stressed with a medicine.  Blood tests.  Coronary angiogram. This is a procedure to look at the coronary arteries to see if there is any blockage.  How is this treated? The treatment of angina may include the following:  Healthy behavioral changes to reduce or control risk factors.  Medicine.  Coronary stenting.A stent helps to keep an artery open.  Coronary angioplasty. This procedure widens a narrowed or blocked artery.  Coronary arterybypass surgery. This will allow your blood to pass the blockage (bypass) to reach your heart.  Follow these instructions at home:  Take medicines only as directed by your health care provider.  Do not take the following medicines unless your health care provider approves: ? Nonsteroidal anti-inflammatory drugs (NSAIDs), such as ibuprofen, naproxen, or celecoxib. ? Vitamin supplements that contain vitamin A, vitamin E, or both. ? Hormone replacement therapy that contains estrogen with or without progestin.  Manage other health conditions such as hypertension and diabetes as directed by your health care provider.  Follow a heart-healthy diet. A dietitian can help to educate you about healthy food options and changes.  Use healthy cooking methods such as roasting, grilling, broiling, baking, poaching, steaming, or stir-frying. Talk to a dietitian to learn more about healthy cooking methods.  Follow an exercise program approved by your health care provider.  Maintain a healthy weight. Lose weight as approved by your health care provider.  Plan rest periods when fatigued.  Learn to manage stress.  Do not use any tobacco products, including cigarettes, chewing tobacco, or electronic cigarettes. If you need help quitting, ask your health care provider.  If you drink alcohol,  and your health care provider approves, limit your alcohol intake to no more than 1 drink per day. One drink equals 12 ounces of beer, 5 ounces of wine, or 1 ounces of hard liquor.  Stop illegal drug use.  Keep all follow-up visits as directed by your health care provider. This is important. Get help right away if:  You have pain in your chest, neck, arm, jaw, stomach, or back that lasts more than a few minutes, is recurring, or is unrelieved by taking sublingualnitroglycerin.  You have profuse sweating without cause.  You have unexplained: ? Heartburn or indigestion. ? Shortness of breath or difficulty breathing. ? Nausea or vomiting. ? Fatigue. ? Feelings of nervousness or anxiety. ? Weakness. ? Diarrhea.  You have sudden light-headedness or dizziness.  You faint. These symptoms may represent a serious problem that is an emergency. Do not wait to see if the symptoms will go away. Get medical help right away. Call your local emergency services (911 in the [place] Do not drive yourself to the hospital. This information is not intended to replace advice given to you by your health care provider. Make sure you discuss any questions you have with your health care provider. Document Released: 08/20/2005 Document Revised: 02/01/2016 Document Reviewed: 12/22/2013 Elsevier Interactive Patient Education  2017 [employer]  

## 2017-07-02 ENCOUNTER — Telehealth: Payer: Self-pay

## 2017-07-02 ENCOUNTER — Ambulatory Visit (AMBULATORY_SURGERY_CENTER): Payer: Self-pay

## 2017-07-02 ENCOUNTER — Encounter: Payer: Self-pay | Admitting: Internal Medicine

## 2017-07-02 VITALS — Ht 75.0 in | Wt 223.2 lb

## 2017-07-02 DIAGNOSIS — Z8371 Family history of colonic polyps: Secondary | ICD-10-CM

## 2017-07-02 NOTE — Telephone Encounter (Signed)
Dr Carlean Purl, This pt is scheduled for an colon with you on 07/15/17. He was seen in ED on 09/ 05/18 with chest pain and saw Dr. Bettina Gavia on 06/27/17 for an evaluation . Is pt ok to proceed with colon at [facility] or does he need an OV first?  Please advise. Thanks for your help. Gwyndolyn Saxon

## 2017-07-02 NOTE — Telephone Encounter (Signed)
I do not see a problem here - ok for [facility]  Thanks

## 2017-07-02 NOTE — Telephone Encounter (Signed)
Ok for colon at [facility] per Dr Carlean Purl. Will proceed as scheduled. Gwyndolyn Saxon

## 2017-07-02 NOTE — Progress Notes (Signed)
Per pt, no allergies to soy or egg products.Pt not taking any weight loss meds or using  O2 at home.   Pt refused Emmi video. 

## 2017-07-15 ENCOUNTER — Encounter: Payer: Self-pay | Admitting: Internal Medicine

## 2017-07-15 ENCOUNTER — Ambulatory Visit (AMBULATORY_SURGERY_CENTER): Payer: 59 | Admitting: Internal Medicine

## 2017-07-15 ENCOUNTER — Other Ambulatory Visit: Payer: Self-pay

## 2017-07-15 VITALS — BP 108/75 | HR 65 | Temp 97.5°F | Resp 21 | Ht 75.0 in | Wt 223.0 lb

## 2017-07-15 DIAGNOSIS — K5730 Diverticulosis of large intestine without perforation or abscess without bleeding: Secondary | ICD-10-CM

## 2017-07-15 DIAGNOSIS — K219 Gastro-esophageal reflux disease without esophagitis: Secondary | ICD-10-CM | POA: Diagnosis not present

## 2017-07-15 DIAGNOSIS — K642 Third degree hemorrhoids: Secondary | ICD-10-CM | POA: Diagnosis not present

## 2017-07-15 DIAGNOSIS — D122 Benign neoplasm of ascending colon: Secondary | ICD-10-CM | POA: Diagnosis not present

## 2017-07-15 DIAGNOSIS — Z1211 Encounter for screening for malignant neoplasm of colon: Secondary | ICD-10-CM | POA: Diagnosis present

## 2017-07-15 MED ORDER — SODIUM CHLORIDE 0.9 % IV SOLN: 500.0000 mL | INTRAVENOUS | Status: DC

## 2017-07-15 NOTE — Op Note (Signed)
[facility] Patient Name: Charles Doyle Procedure Date: 07/15/2017 8:01 AM MRN: [id number] Endoscopist: Gatha Mayer , MD Age: 56 Referring MD:  Date of Birth: [birth year] Gender: Male Account #: [id number] Procedure:                Colonoscopy Indications:              Screening for colorectal malignant neoplasm Medicines:                Propofol per Anesthesia, Monitored Anesthesia Care Procedure:                Pre-Anesthesia Assessment:                           - Prior to the procedure, a History and Physical                            was performed, and patient medications and                            allergies were reviewed. The patient's tolerance of                            previous anesthesia was also reviewed. The risks                            and benefits of the procedure and the sedation                            options and risks were discussed with the patient.                            All questions were answered, and informed consent                            was obtained. Prior Anticoagulants: The patient has                            taken no previous anticoagulant or antiplatelet                            agents. ASA Grade Assessment: II - A patient with                            mild systemic disease. After reviewing the risks                            and benefits, the patient was deemed in                            satisfactory condition to undergo the procedure.                           After obtaining informed consent, the colonoscope  was passed under direct vision. Throughout the                            procedure, the patient's blood pressure, pulse, and                            oxygen saturations were monitored continuously. The                            Colonoscope was introduced through the anus and                            advanced to the the cecum, identified by   appendiceal orifice and ileocecal valve. The                            colonoscopy was performed without difficulty. The                            patient tolerated the procedure well. The quality                            of the bowel preparation was good. The bowel                            preparation used was Miralax. The ileocecal valve,                            appendiceal orifice, and rectum were photographed. Scope In: 8:07:05 AM Scope Out: 8:24:54 AM Scope Withdrawal Time: 0 hours 14 minutes 47 seconds  Total Procedure Duration: 0 hours 17 minutes 49 seconds  Findings:                 The perianal and digital rectal examinations were                            normal. Pertinent negatives include normal prostate                            (size, shape, and consistency).                           A diminutive polyp was found in the ascending                            colon. The polyp was sessile. The polyp was removed                            with a cold snare. Resection and retrieval were                            complete. Verification of patient identification                            for  the specimen was done. Estimated blood loss was                            minimal.                           Internal hemorrhoids were found during retroflexion                            and during endoscopy.                           Scattered diverticula were found in the entire                            colon.                           The exam was otherwise without abnormality on                            direct and retroflexion views. Complications:            No immediate complications. Estimated Blood Loss:     Estimated blood loss was minimal. Impression:               - One diminutive polyp in the ascending colon,                            removed with a cold snare. Resected and retrieved.                           - Internal hemorrhoids.                           -  Diverticulosis in the entire examined colon.                           - The examination was otherwise normal on direct                            and retroflexion views. Recommendation:           - Patient has a contact number available for                            emergencies. The signs and symptoms of potential                            delayed complications were discussed with the                            patient. Return to normal activities tomorrow.                            Written discharge instructions were provided to the  patient.                           - Resume previous diet.                           - Continue present medications.                           - Repeat colonoscopy is recommended. The                            colonoscopy date will be determined after pathology                            results from today's exam become available for                            review.                           - Consider hemorrhoid ligation - he has Gr 3                            internal hemorrhoids by hx Gatha Mayer, MD 07/15/2017 8:37:15 AM This report has been signed electronically.

## 2017-07-15 NOTE — Progress Notes (Signed)
Report given to PACU, vss 

## 2017-07-15 NOTE — Patient Instructions (Signed)
There was one tiny little polyp removed. ? If really a polyp. Also some diverticulosis and internal hemorrhoids.  If you want internal hemorrhoids fixed I can do that. Please read the handout.    YOU HAD AN ENDOSCOPIC PROCEDURE TODAY AT [facility]:   Refer to the procedure report that was given to you for any specific questions about what was found during the examination.  If the procedure report does not answer your questions, please call your gastroenterologist to clarify.  If you requested that your care partner not be given the details of your procedure findings, then the procedure report has been included in a sealed envelope for you to review at your convenience later.  YOU SHOULD EXPECT: Some feelings of bloating in the abdomen. Passage of more gas than usual.  Walking can help get rid of the air that was put into your GI tract during the procedure and reduce the bloating. If you had a lower endoscopy (such as a colonoscopy or flexible sigmoidoscopy) you may notice spotting of blood in your stool or on the toilet paper. If you underwent a bowel prep for your procedure, you may not have a normal bowel movement for a few days.  Please Note:  You might notice some irritation and congestion in your nose or some drainage.  This is from the oxygen used during your procedure.  There is no need for concern and it should clear up in a day or so.  SYMPTOMS TO REPORT IMMEDIATELY:   Following lower endoscopy (colonoscopy or flexible sigmoidoscopy):  Excessive amounts of blood in the stool  Significant tenderness or worsening of abdominal pains  Swelling of the abdomen that is new, acute  Fever of 100F or higher   For urgent or emergent issues, a gastroenterologist can be reached at any hour by calling [phone].   DIET:  We do recommend a small meal at first, but then you may proceed to your regular diet.  Drink plenty of fluids but you should avoid alcoholic  beverages for 24 hours.  ACTIVITY:  You should plan to take it easy for the rest of today and you should NOT DRIVE or use heavy machinery until tomorrow (because of the sedation medicines used during the test).    FOLLOW UP: Our staff will call the number listed on your records the next business day following your procedure to check on you and address any questions or concerns that you may have regarding the information given to you following your procedure. If we do not reach you, we will leave a message.  However, if you are feeling well and you are not experiencing any problems, there is no need to return our call.  We will assume that you have returned to your regular daily activities without incident.  If any biopsies were taken you will be contacted by phone or by letter within the next 1-3 weeks.  Please call us at [phone] if you have not heard about the biopsies in 3 weeks.    SIGNATURES/CONFIDENTIALITY: You and/or your care partner have signed paperwork which will be entered into your electronic medical record.  These signatures attest to the fact that that the information above on your After Visit Summary has been reviewed and is understood.  Full responsibility of the confidentiality of this discharge information lies with you and/or your care-partner.   Handouts were given to your care partner on polyps, diverticulosis, hemorrhoid, and hemorrhoid banding. Please call Dr.  Gessner's office if you are interested in having internal hemorrhoids banded. You may resume your current medications today. Await biopsy results. Please call if any questions or concerns.

## 2017-07-15 NOTE — Progress Notes (Signed)
Called to room to assist during endoscopic procedure.  Patient ID and intended procedure confirmed with present staff. Received instructions for my participation in the procedure from the performing physician.  

## 2017-07-15 NOTE — Progress Notes (Signed)
No problems noted in the recovery room. maw 

## 2017-07-16 ENCOUNTER — Telehealth: Payer: Self-pay | Admitting: *Deleted

## 2017-07-16 NOTE — Telephone Encounter (Signed)
  Follow up Call-  Call back number 07/15/2017  Post procedure Call Back phone  # [phone]  Permission to leave phone message Yes  Some recent data might be hidden     Patient questions:  Do you have a fever, pain , or abdominal swelling? No. Pain Score  0 *  Have you tolerated food without any problems? Yes.    Have you been able to return to your normal activities? Yes.    Do you have any questions about your discharge instructions: Diet   No. Medications  No. Follow up visit  No.  Do you have questions or concerns about your Care? No.  Actions: * If pain score is 4 or above: No action needed, pain <4.

## 2017-07-23 ENCOUNTER — Encounter: Payer: Self-pay | Admitting: Internal Medicine

## 2017-07-23 NOTE — Progress Notes (Signed)
Not a true polyp recall 2028  my chart letter

## 2017-11-01 ENCOUNTER — Encounter: Payer: Self-pay | Admitting: Internal Medicine

## 2017-11-13 ENCOUNTER — Ambulatory Visit: Payer: 59 | Admitting: Internal Medicine

## 2017-11-13 ENCOUNTER — Encounter: Payer: Self-pay | Admitting: Internal Medicine

## 2017-11-13 DIAGNOSIS — K642 Third degree hemorrhoids: Secondary | ICD-10-CM | POA: Diagnosis not present

## 2017-11-13 NOTE — Assessment & Plan Note (Signed)
Banded all 3 - Gr 2 on exam Gr 3 by hx RTC prn

## 2017-11-13 NOTE — Patient Instructions (Addendum)
HEMORRHOID BANDING PROCEDURE    FOLLOW-UP CARE   1. The procedure you have had should have been relatively painless since the banding of the area involved does not have nerve endings and there is no pain sensation.  The rubber band cuts off the blood supply to the hemorrhoid and the band may fall off as soon as 48 hours after the banding (the band may occasionally be seen in the toilet bowl following a bowel movement). You may notice a temporary feeling of fullness in the rectum which should respond adequately to plain Tylenol or Motrin.  2. Following the banding, avoid strenuous exercise that evening and resume full activity the next day.  A sitz bath (soaking in a warm tub) or bidet is soothing, and can be useful for cleansing the area after bowel movements.     3. To avoid constipation, take two tablespoons of natural wheat bran, natural oat bran, flax, Benefiber or any over the counter fiber supplement and increase your water intake to 7-8 glasses daily.    4. Unless you have been prescribed anorectal medication, do not put anything inside your rectum for two weeks: No suppositories, enemas, fingers, etc.  5. Occasionally, you may have more bleeding than usual after the banding procedure.  This is often from the untreated hemorrhoids rather than the treated one.  Don't be concerned if there is a tablespoon or so of blood.  If there is more blood than this, lie flat with your bottom higher than your head and apply an ice pack to the area. If the bleeding does not stop within a half an hour or if you feel faint, call our office at [phone] or go to the emergency room.  6. Problems are not common; however, if there is a substantial amount of bleeding, severe pain, chills, fever or difficulty passing urine (very rare) or other problems, you should call us at [phone] or report to the nearest emergency room.  7. Do not stay seated continuously for more than 2-3 hours for a day or two  after the procedure.  Tighten your buttock muscles 10-15 times every two hours and take 10-15 deep breaths every 1-2 hours.  Do not spend more than a few minutes on the toilet if you cannot empty your bowel; instead re-visit the toilet at a later time.    Take benefiber for a month and follow up with Dr Carlean Purl as needed.   I appreciate the opportunity to care for you. Silvano Rusk, MD, [facility]    Epic went down when patient was here and Dr Carlean Purl hand wrote instructions.

## 2017-11-13 NOTE — Progress Notes (Signed)
   HEMORRHOID LIGATION:  Sxs are rectal bleeding and grade 3 prolapse  Had colonoscopy 07/2017 diverticulosis, hemorrhoids and polyp (not precancerous)  No defecation difficulty   PROCEDURE NOTE: The patient presents with symptomatic grade 3  hemorrhoids, requesting rubber band ligation of his/her hemorrhoidal disease.  All risks, benefits and alternative forms of therapy were described and informed consent was obtained.  In the Left Lateral Decubitus position anoscopic examination revealed grade 2 hemorrhoids in the ALL  position(s).  The anorectum was pre-medicated with 0.125%NTG and 5% lidocaine  The decision was made to band the all 3  internal hemorrhoids, and the [place] was used to perform band ligation without complication.  Digital anorectal examination was then performed to assure proper positioning of the band, and to adjust the banded tissue as required.  The patient was discharged home without pain or other issues.  Dietary and behavioral recommendations were given and along with follow-up instructions.     The following adjunctive treatments were recommended:  Benefiber 1 tbsp qd x 1 month  The patient will return prn for  follow-up and possible additional banding as required. No complications were encountered and the patient tolerated the procedure well.  I appreciate the opportunity to care for him. ZP:SUGAY, Claudina Lick, MD

## 2018-02-03 IMAGING — CT CT HEART MORP W/ CTA COR W/ SCORE W/ CA W/CM &/OR W/O CM
4 of 7 series · 8 of 20 positions shown, 9 images · IV contrast (APPLIED)
Comparison: None.

CLINICAL DATA: 56-year-old male with atypical chest pain and family
h/o premature CAD.

EXAM:
Cardiac/Coronary  CT
TECHNIQUE: The patient was scanned on a [employer] Force scanner.

[Series 6: best diast 69 % · axial · 0.40mm/px · z∈[-308,-245]mm · 2 of 468 slices shown, 3 images]
[im 156/468  vessel]
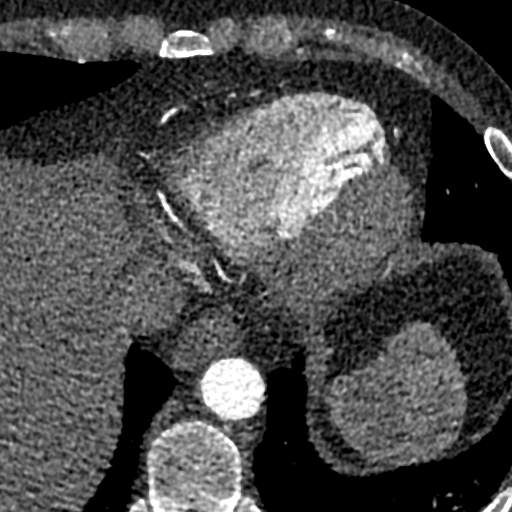
[im 156/468  lung]
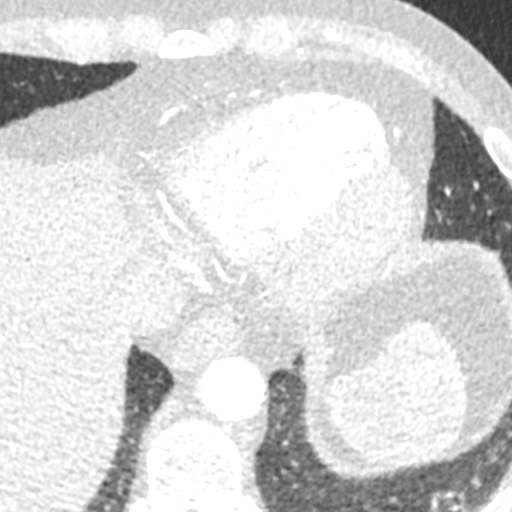
[im 312/468  vessel]
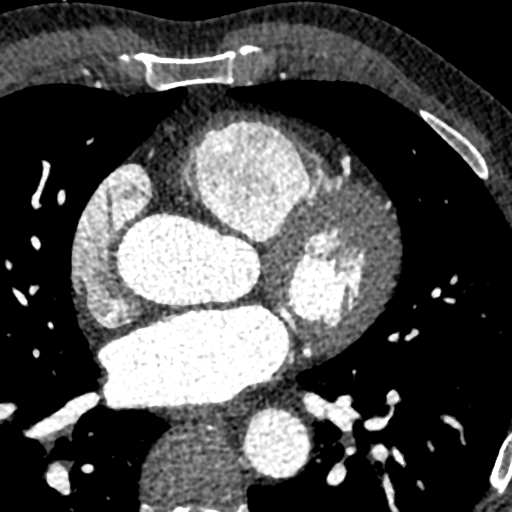

[Series 7: best syst 40 % · axial · 0.40mm/px · z∈[-308,-245]mm · 2 of 468 slices shown]
[im 156/468  vessel]
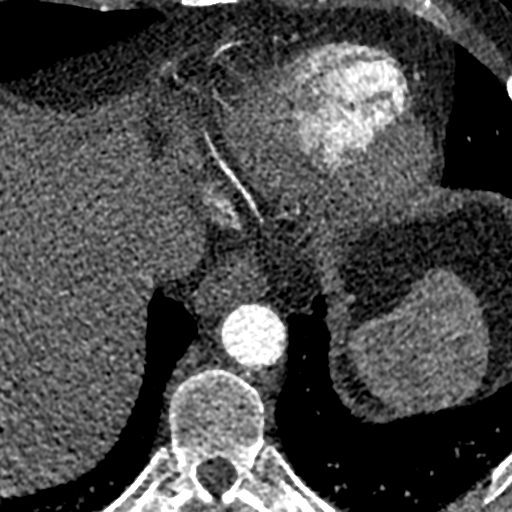
[im 312/468  vessel]
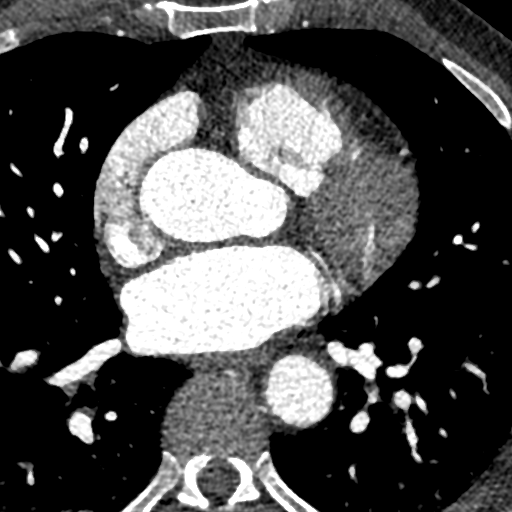

[Series 8: ts diast sharp 69 % · axial · 0.44mm/px · z∈[-308,-245]mm · 2 of 468 slices shown]
[im 156/468  lung]
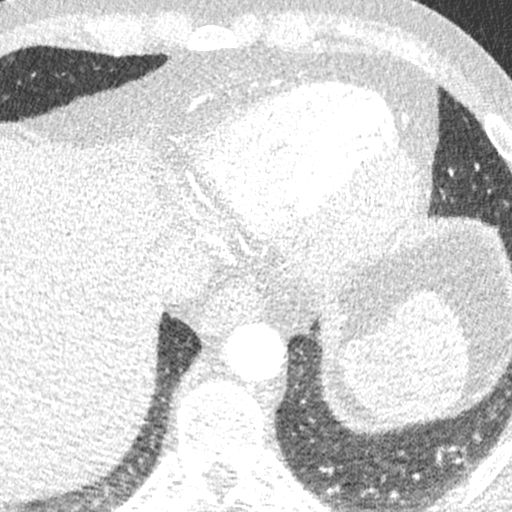
[im 312/468  lung]
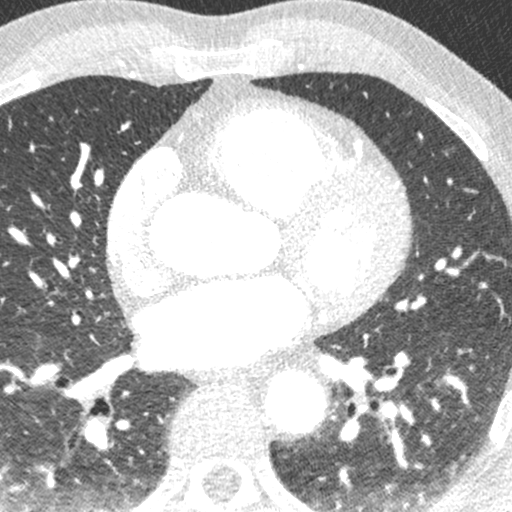

[Series 9: ts syst sharp 39 % · axial · 0.44mm/px · z∈[-308,-245]mm · 2 of 468 slices shown]
[im 156/468  lung]
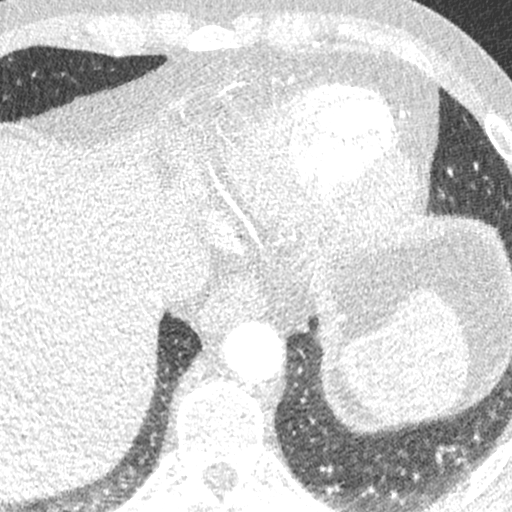
[im 312/468  lung]
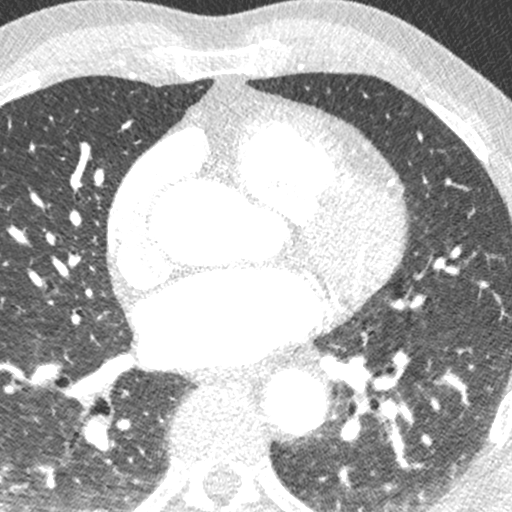

[8 of 20 positions shown; findings below may reference images not displayed]

FINDINGS: A 120 kV prospective scan was triggered in the descending thoracic
aorta at 111 HU's. Axial non-contrast 3 mm slices were carried out
through the heart. The data set was analyzed on a dedicated work
station and scored using the Agatson method. Gantry rotation speed
was 250 msecs and collimation was .6 mm. 5 mg of iv Metoprolol and
0.8 mg of sl NTG was given. The 3D data set was reconstructed in 5%
intervals of the 67-82 % of the R-R cycle. Diastolic phases were
analyzed on a dedicated work station using MPR, MIP and VRT modes.
The patient received 80 cc of contrast.

Aorta:  Normal size.  No calcifications.  No dissection.

Aortic Valve:  Trileaflet.  No calcifications.

Coronary Arteries:  Normal coronary origin.  Right dominance.

RCA is a large dominant artery that gives rise to acute marginal
branch, a small PDA and PLVB. There is no plaque.

Left main is a very short artery that gives rise to LAD and LCX
arteries and has no plaque.

LAD is a large vessel that gives rise to two diagonal branches and
has no plaque. There is a long intramyocardial bridge in mid LAD.

LCX is a non-dominant artery that gives rise to one OM1 branch.
There is no plaque.

Other findings:

Normal pulmonary vein drainage into the left atrium.

Normal let atrial appendage without a thrombus.

Mildly dilated pulmonary artery measuring 33 mm.
IMPRESSION: 1. Coronary calcium score of 0. This was 0 percentile for age and
sex matched control.

2. Normal coronary origin with right dominance.

3. No evidence of CAD. There is a long intramyocardial bridge in mid
LAD. A therapy with beta-blockers is recommended

4. Mildly dilated pulmonary artery measuring 33 mm suggestive of
pulmonary hypertension.

Gisel Christine

EXAM:
OVER-READ INTERPRETATION  CT CHEST

The following report is an over-read performed by radiologist Dr.
Jovica Vesna Ocone [REDACTED] on 06/13/2017. This
over-read does not include interpretation of cardiac or coronary
anatomy or pathology. The coronary CTA interpretation by the
cardiologist is attached.
FINDINGS: Cardiovascular: Slight dilatation of the proximal ascending thoracic
aorta, 4 cm maximally. Heart is normal size.

Mediastinum/Nodes: No adenopathy in the visualized lower mediastinum
or hila.

Lungs/Pleura: No confluent airspace opacities or effusions.

Upper Abdomen: Small low-density lesion in the left hepatic dome
measures 2.2 cm, likely small cyst. No acute findings.

Musculoskeletal: Chest wall soft tissues are unremarkable. No acute
bony abnormality.
IMPRESSION: 4 cm ascending thoracic aortic aneurysm. Recommend annual imaging
followup by CTA or MRA. This recommendation follows 9191
ACCF/AHA/AATS/ACR/ASA/SCA/ESMERAL/TIGER/TIUKU/NGUS Guidelines for the
Diagnosis and Management of Patients with Thoracic Aortic Disease.
Circulation. 9191; 121: [id number]

## 2019-04-28 ENCOUNTER — Encounter: Payer: Self-pay | Admitting: Internal Medicine
